# Patient Record
Sex: Female | Born: 1970 | Race: Black or African American | Hispanic: No | Marital: Single | Smoking: Current some day smoker
Health system: Southern US, Community
[De-identification: ages and names within clinical notes are randomized; demographics above are authoritative.]

## PROBLEM LIST (undated history)

## (undated) DIAGNOSIS — N390 Urinary tract infection, site not specified: Secondary | ICD-10-CM

## (undated) DIAGNOSIS — I10 Essential (primary) hypertension: Secondary | ICD-10-CM

## (undated) HISTORY — PX: NO PAST SURGERIES: SHX2092

## (undated) HISTORY — PX: WISDOM TOOTH EXTRACTION: SHX21

---

## 1998-03-17 ENCOUNTER — Emergency Department (HOSPITAL_COMMUNITY): Admission: EM | Admit: 1998-03-17 | Discharge: 1998-03-17 | Payer: Self-pay | Admitting: Emergency Medicine

## 1998-06-04 ENCOUNTER — Emergency Department (HOSPITAL_COMMUNITY): Admission: EM | Admit: 1998-06-04 | Discharge: 1998-06-04 | Payer: Self-pay | Admitting: Emergency Medicine

## 1998-06-25 ENCOUNTER — Emergency Department (HOSPITAL_COMMUNITY): Admission: EM | Admit: 1998-06-25 | Discharge: 1998-06-25 | Payer: Self-pay | Admitting: Emergency Medicine

## 1998-09-14 ENCOUNTER — Emergency Department (HOSPITAL_COMMUNITY): Admission: EM | Admit: 1998-09-14 | Discharge: 1998-09-14 | Payer: Self-pay | Admitting: Family Medicine

## 1998-09-24 ENCOUNTER — Emergency Department (HOSPITAL_COMMUNITY): Admission: EM | Admit: 1998-09-24 | Discharge: 1998-09-24 | Payer: Self-pay | Admitting: Emergency Medicine

## 1998-10-21 ENCOUNTER — Emergency Department (HOSPITAL_COMMUNITY): Admission: EM | Admit: 1998-10-21 | Discharge: 1998-10-21 | Payer: Self-pay

## 1998-12-06 ENCOUNTER — Emergency Department (HOSPITAL_COMMUNITY): Admission: EM | Admit: 1998-12-06 | Discharge: 1998-12-06 | Payer: Self-pay | Admitting: Emergency Medicine

## 1998-12-16 ENCOUNTER — Emergency Department (HOSPITAL_COMMUNITY): Admission: EM | Admit: 1998-12-16 | Discharge: 1998-12-16 | Payer: Self-pay | Admitting: Emergency Medicine

## 1999-02-01 ENCOUNTER — Emergency Department (HOSPITAL_COMMUNITY): Admission: EM | Admit: 1999-02-01 | Discharge: 1999-02-01 | Payer: Self-pay | Admitting: Emergency Medicine

## 1999-03-07 ENCOUNTER — Emergency Department (HOSPITAL_COMMUNITY): Admission: EM | Admit: 1999-03-07 | Discharge: 1999-03-07 | Payer: Self-pay | Admitting: Emergency Medicine

## 1999-04-11 ENCOUNTER — Emergency Department (HOSPITAL_COMMUNITY): Admission: EM | Admit: 1999-04-11 | Discharge: 1999-04-11 | Payer: Self-pay | Admitting: Emergency Medicine

## 1999-04-11 ENCOUNTER — Encounter: Payer: Self-pay | Admitting: Emergency Medicine

## 1999-05-13 ENCOUNTER — Emergency Department (HOSPITAL_COMMUNITY): Admission: EM | Admit: 1999-05-13 | Discharge: 1999-05-13 | Payer: Self-pay | Admitting: *Deleted

## 2000-04-12 ENCOUNTER — Emergency Department (HOSPITAL_COMMUNITY): Admission: EM | Admit: 2000-04-12 | Discharge: 2000-04-12 | Payer: Self-pay | Admitting: Emergency Medicine

## 2000-08-22 ENCOUNTER — Encounter: Payer: Self-pay | Admitting: Emergency Medicine

## 2000-08-22 ENCOUNTER — Emergency Department (HOSPITAL_COMMUNITY): Admission: EM | Admit: 2000-08-22 | Discharge: 2000-08-22 | Payer: Self-pay | Admitting: Emergency Medicine

## 2001-03-10 ENCOUNTER — Encounter (HOSPITAL_BASED_OUTPATIENT_CLINIC_OR_DEPARTMENT_OTHER): Payer: Self-pay | Admitting: General Surgery

## 2001-03-10 ENCOUNTER — Encounter: Admission: RE | Admit: 2001-03-10 | Discharge: 2001-03-10 | Payer: Self-pay | Admitting: Obstetrics

## 2001-03-20 ENCOUNTER — Emergency Department (HOSPITAL_COMMUNITY): Admission: EM | Admit: 2001-03-20 | Discharge: 2001-03-20 | Payer: Self-pay | Admitting: Emergency Medicine

## 2001-06-03 ENCOUNTER — Emergency Department (HOSPITAL_COMMUNITY): Admission: EM | Admit: 2001-06-03 | Discharge: 2001-06-03 | Payer: Self-pay | Admitting: Internal Medicine

## 2001-07-10 ENCOUNTER — Emergency Department (HOSPITAL_COMMUNITY): Admission: EM | Admit: 2001-07-10 | Discharge: 2001-07-10 | Payer: Self-pay | Admitting: Emergency Medicine

## 2001-10-24 ENCOUNTER — Emergency Department (HOSPITAL_COMMUNITY): Admission: EM | Admit: 2001-10-24 | Discharge: 2001-10-24 | Payer: Self-pay | Admitting: Emergency Medicine

## 2001-12-07 ENCOUNTER — Inpatient Hospital Stay (HOSPITAL_COMMUNITY): Admission: AD | Admit: 2001-12-07 | Discharge: 2001-12-07 | Payer: Self-pay | Admitting: *Deleted

## 2002-03-13 ENCOUNTER — Inpatient Hospital Stay (HOSPITAL_COMMUNITY): Admission: AD | Admit: 2002-03-13 | Discharge: 2002-03-13 | Payer: Self-pay | Admitting: *Deleted

## 2002-03-15 ENCOUNTER — Emergency Department (HOSPITAL_COMMUNITY): Admission: EM | Admit: 2002-03-15 | Discharge: 2002-03-15 | Payer: Self-pay | Admitting: Emergency Medicine

## 2002-06-17 ENCOUNTER — Emergency Department (HOSPITAL_COMMUNITY): Admission: EM | Admit: 2002-06-17 | Discharge: 2002-06-17 | Payer: Self-pay | Admitting: Emergency Medicine

## 2002-06-26 ENCOUNTER — Inpatient Hospital Stay (HOSPITAL_COMMUNITY): Admission: AD | Admit: 2002-06-26 | Discharge: 2002-06-26 | Payer: Self-pay | Admitting: Obstetrics and Gynecology

## 2004-02-25 ENCOUNTER — Inpatient Hospital Stay (HOSPITAL_COMMUNITY): Admission: AD | Admit: 2004-02-25 | Discharge: 2004-02-25 | Payer: Self-pay | Admitting: Obstetrics & Gynecology

## 2005-05-06 ENCOUNTER — Inpatient Hospital Stay (HOSPITAL_COMMUNITY): Admission: AD | Admit: 2005-05-06 | Discharge: 2005-05-06 | Payer: Self-pay | Admitting: *Deleted

## 2005-07-29 ENCOUNTER — Emergency Department (HOSPITAL_COMMUNITY): Admission: EM | Admit: 2005-07-29 | Discharge: 2005-07-29 | Payer: Self-pay | Admitting: Emergency Medicine

## 2005-10-22 ENCOUNTER — Emergency Department (HOSPITAL_COMMUNITY): Admission: EM | Admit: 2005-10-22 | Discharge: 2005-10-22 | Payer: Self-pay | Admitting: Emergency Medicine

## 2006-02-03 ENCOUNTER — Emergency Department (HOSPITAL_COMMUNITY): Admission: EM | Admit: 2006-02-03 | Discharge: 2006-02-03 | Payer: Self-pay | Admitting: *Deleted

## 2007-01-14 ENCOUNTER — Emergency Department (HOSPITAL_COMMUNITY): Admission: EM | Admit: 2007-01-14 | Discharge: 2007-01-14 | Payer: Self-pay | Admitting: Emergency Medicine

## 2007-09-09 ENCOUNTER — Emergency Department (HOSPITAL_COMMUNITY): Admission: EM | Admit: 2007-09-09 | Discharge: 2007-09-10 | Payer: Self-pay | Admitting: Emergency Medicine

## 2007-09-12 ENCOUNTER — Emergency Department (HOSPITAL_COMMUNITY): Admission: EM | Admit: 2007-09-12 | Discharge: 2007-09-12 | Payer: Self-pay | Admitting: Emergency Medicine

## 2007-11-27 ENCOUNTER — Emergency Department (HOSPITAL_COMMUNITY): Admission: EM | Admit: 2007-11-27 | Discharge: 2007-11-27 | Payer: Self-pay | Admitting: Family Medicine

## 2009-01-02 ENCOUNTER — Emergency Department (HOSPITAL_COMMUNITY): Admission: EM | Admit: 2009-01-02 | Discharge: 2009-01-02 | Payer: Self-pay | Admitting: Emergency Medicine

## 2010-01-16 ENCOUNTER — Emergency Department (HOSPITAL_COMMUNITY): Admission: EM | Admit: 2010-01-16 | Discharge: 2010-01-16 | Payer: Self-pay | Admitting: Emergency Medicine

## 2011-02-18 ENCOUNTER — Inpatient Hospital Stay (INDEPENDENT_AMBULATORY_CARE_PROVIDER_SITE_OTHER)
Admission: RE | Admit: 2011-02-18 | Discharge: 2011-02-18 | Disposition: A | Discharge status: Home / Self Care | Payer: Self-pay | Source: Ambulatory Visit | Attending: Family Medicine | Admitting: Family Medicine

## 2011-02-18 DIAGNOSIS — T148XXA Other injury of unspecified body region, initial encounter: Secondary | ICD-10-CM

## 2011-02-18 DIAGNOSIS — W503XXA Accidental bite by another person, initial encounter: Secondary | ICD-10-CM

## 2011-02-20 ENCOUNTER — Inpatient Hospital Stay (HOSPITAL_COMMUNITY)
Admission: RE | Admit: 2011-02-20 | Discharge: 2011-02-20 | Disposition: A | Discharge status: Home / Self Care | Payer: Self-pay | Source: Ambulatory Visit | Attending: Family Medicine | Admitting: Family Medicine

## 2011-03-06 ENCOUNTER — Inpatient Hospital Stay (INDEPENDENT_AMBULATORY_CARE_PROVIDER_SITE_OTHER)
Admission: RE | Admit: 2011-03-06 | Discharge: 2011-03-06 | Disposition: A | Discharge status: Home / Self Care | Payer: Self-pay | Source: Ambulatory Visit | Attending: Emergency Medicine | Admitting: Emergency Medicine

## 2011-03-06 DIAGNOSIS — N949 Unspecified condition associated with female genital organs and menstrual cycle: Secondary | ICD-10-CM

## 2011-03-06 LAB — WET PREP, GENITAL: Yeast Wet Prep HPF POC: NONE SEEN

## 2011-03-07 LAB — GC/CHLAMYDIA PROBE AMP, GENITAL: GC Probe Amp, Genital: NEGATIVE

## 2011-06-14 ENCOUNTER — Inpatient Hospital Stay (INDEPENDENT_AMBULATORY_CARE_PROVIDER_SITE_OTHER)
Admission: RE | Admit: 2011-06-14 | Discharge: 2011-06-14 | Disposition: A | Discharge status: Home / Self Care | Payer: Self-pay | Source: Ambulatory Visit | Attending: Family Medicine | Admitting: Family Medicine

## 2011-06-14 DIAGNOSIS — R05 Cough: Secondary | ICD-10-CM

## 2011-06-14 DIAGNOSIS — J029 Acute pharyngitis, unspecified: Secondary | ICD-10-CM

## 2011-08-14 LAB — CULTURE, ROUTINE-ABSCESS: Gram Stain: NONE SEEN

## 2011-08-14 LAB — RPR: RPR Ser Ql: NONREACTIVE

## 2011-08-14 LAB — URINALYSIS, ROUTINE W REFLEX MICROSCOPIC
Bilirubin Urine: NEGATIVE
Ketones, ur: NEGATIVE
pH: 6.5

## 2011-08-14 LAB — WET PREP, GENITAL
Trich, Wet Prep: NONE SEEN
Yeast Wet Prep HPF POC: NONE SEEN

## 2011-08-14 LAB — URINE CULTURE: Colony Count: 15000

## 2011-08-14 LAB — GC/CHLAMYDIA PROBE AMP, GENITAL: Chlamydia, DNA Probe: NEGATIVE

## 2011-08-14 LAB — URINE MICROSCOPIC-ADD ON

## 2011-09-05 ENCOUNTER — Ambulatory Visit (INDEPENDENT_AMBULATORY_CARE_PROVIDER_SITE_OTHER): Payer: Self-pay

## 2011-09-05 ENCOUNTER — Inpatient Hospital Stay (INDEPENDENT_AMBULATORY_CARE_PROVIDER_SITE_OTHER)
Admission: RE | Admit: 2011-09-05 | Discharge: 2011-09-05 | Disposition: A | Discharge status: Home / Self Care | Payer: Self-pay | Source: Ambulatory Visit | Attending: Family Medicine | Admitting: Family Medicine

## 2011-09-05 DIAGNOSIS — J40 Bronchitis, not specified as acute or chronic: Secondary | ICD-10-CM

## 2011-09-05 DIAGNOSIS — J019 Acute sinusitis, unspecified: Secondary | ICD-10-CM

## 2011-09-05 DIAGNOSIS — I10 Essential (primary) hypertension: Secondary | ICD-10-CM

## 2011-09-05 LAB — POCT I-STAT, CHEM 8
Chloride: 102 mEq/L (ref 96–112)
Creatinine, Ser: 0.9 mg/dL (ref 0.50–1.10)
Glucose, Bld: 115 mg/dL — ABNORMAL HIGH (ref 70–99)
HCT: 44 % (ref 36.0–46.0)
Hemoglobin: 15 g/dL (ref 12.0–15.0)
Sodium: 138 mEq/L (ref 135–145)

## 2013-06-08 ENCOUNTER — Telehealth: Payer: Self-pay | Admitting: Advanced Practice Midwife

## 2013-06-08 ENCOUNTER — Inpatient Hospital Stay (HOSPITAL_COMMUNITY)
Admission: AD | Admit: 2013-06-08 | Discharge: 2013-06-08 | Disposition: A | Discharge status: Home / Self Care | Payer: Self-pay | Source: Ambulatory Visit | Attending: Obstetrics and Gynecology | Admitting: Obstetrics and Gynecology

## 2013-06-08 ENCOUNTER — Encounter (HOSPITAL_COMMUNITY): Payer: Self-pay

## 2013-06-08 DIAGNOSIS — I10 Essential (primary) hypertension: Secondary | ICD-10-CM | POA: Insufficient documentation

## 2013-06-08 DIAGNOSIS — Z2089 Contact with and (suspected) exposure to other communicable diseases: Secondary | ICD-10-CM

## 2013-06-08 DIAGNOSIS — Z202 Contact with and (suspected) exposure to infections with a predominantly sexual mode of transmission: Secondary | ICD-10-CM | POA: Insufficient documentation

## 2013-06-08 DIAGNOSIS — A5901 Trichomonal vulvovaginitis: Secondary | ICD-10-CM | POA: Insufficient documentation

## 2013-06-08 HISTORY — DX: Urinary tract infection, site not specified: N39.0

## 2013-06-08 LAB — WET PREP, GENITAL: Yeast Wet Prep HPF POC: NONE SEEN

## 2013-06-08 MED ORDER — LISINOPRIL 10 MG PO TABS: 10.0000 mg | ORAL_TABLET | Freq: Every day | ORAL | Status: DC

## 2013-06-08 MED ORDER — METRONIDAZOLE 500 MG PO TABS: 2000.0000 mg | ORAL_TABLET | Freq: Once | ORAL | Status: AC

## 2013-06-08 MED ORDER — PROMETHAZINE HCL 25 MG PO TABS: 25.0000 mg | ORAL_TABLET | Freq: Four times a day (QID) | ORAL | Status: DC | PRN

## 2013-06-08 NOTE — MAU Note (Signed)
Patient denies any complaints, discomfort. She states that a friend of hers (a man that she is not sexually involved with) that is in the hospital called her and told her that she needs to be tested. He is diagnosed with Trich. Patient denies of pain, dysuria, vaginal bleeding or discharge.

## 2013-06-08 NOTE — MAU Note (Signed)
Patient states a friend of hers has trich and she was told she needed to come and get checked. States she does not have sexual contact with this person. Denies any problems.

## 2013-06-08 NOTE — MAU Provider Note (Signed)
Chief Complaint: No chief complaint on file.   First Provider Initiated Contact with Patient 06/08/13 1212     SUBJECTIVE HPI: Chelsea Washington is a 42 y.o. non-pregnant who presents to MAU stating that she has a friend who was Dx w/ oral Trichomonas and was told that all of his contacts needed to be tested for Trich. Pt denies any type of sexual or close physical contact with this person and denies vaginal discharge.   Past Medical History  Diagnosis Date  . UTI (lower urinary tract infection)    OB History   Grav Para Term Preterm Abortions TAB SAB Ect Mult Living                 Past Surgical History  Procedure Laterality Date  . Wisdom tooth extraction     History   Social History  . Marital Status: Single    Spouse Name: N/A    Number of Children: N/A  . Years of Education: N/A   Occupational History  . Not on file.   Social History Main Topics  . Smoking status: Not on file  . Smokeless tobacco: Not on file  . Alcohol Use: No  . Drug Use: No  . Sexually Active: Not Currently    Birth Control/ Protection: None   Other Topics Concern  . Not on file   Social History Narrative  . No narrative on file   No current facility-administered medications on file prior to encounter.   No current outpatient prescriptions on file prior to encounter.   Allergies  Allergen Reactions  . Peach (Prunus Persica) Hives    ROS: Pertinent pos items in HPI. Denies chest pain, SOB, HA.   OBJECTIVE Blood pressure 170/108, pulse 78, temperature 98.4 F (36.9 C), temperature source Oral, resp. rate 16, height 5\' 5"  (1.651 m), weight 59.512 kg (131 lb 3.2 oz), last menstrual period 06/05/2013, SpO2 100.00%. Patient Vitals for the past 24 hrs:  BP Temp Temp src Pulse Resp SpO2 Height Weight  06/08/13 1212 179/117 mmHg - - 69 16 - - -  06/08/13 0954 170/108 mmHg 98.4 F (36.9 C) Oral 78 16 100 % 5\' 5"  (1.651 m) 59.512 kg (131 lb 3.2 oz)    GENERAL: Well-developed, well-nourished  female in no acute distress.  HEENT: Normocephalic HEART: normal rate and rhythm.  RESP: normal effort ABDOMEN: Soft, non-tender EXTREMITIES: Nontender, no edema NEURO: Alert and oriented SPECULUM EXAM: NEFG, small amount of mildly malodorous blood noted, cervix clean BIMANUAL: cervix closed; uterus normal size, no adnexal tenderness or masses. No CMT.  LAB RESULTS Results for orders placed during the hospital encounter of 06/08/13 (from the past 24 hour(s))  WET PREP, GENITAL     Status: Abnormal   Collection Time    06/08/13 11:50 AM      Result Value Range   Yeast Wet Prep HPF POC NONE SEEN  NONE SEEN   Trich, Wet Prep FEW (*) NONE SEEN   Clue Cells Wet Prep HPF POC NONE SEEN  NONE SEEN   WBC, Wet Prep HPF POC FEW (*) NONE SEEN    IMAGING No results found.  MAU COURSE Initially denied ever being told that she was hypertensive, then stated that she had been informed of HTN in past. No PCP. No meds. Per consult w/ Dr. Jolayne Panther will start Lisinopril and refer to [facility].    ASSESSMENT 1. Exposure to STD-Trichimonas.   2. Hypertension-uncontrolled    PLAN Discharge home in stable condition.  Hypertension precautions strongly reviewed. Go to ED for chest pain , SOB, HA, stroke Sx.  GC/CT pending. Follow-up Information   Follow up with Gynecologist. (As needed)       Follow up with [facility]. (As needed if symptoms worsen)    Contact information:   [address] [id number] [place] [address] [phone]       Medication List         lisinopril 10 MG tablet  Commonly known as:  PRINIVIL,ZESTRIL  Take 1 tablet (10 mg total) by mouth daily.     OVER THE COUNTER MEDICATION  Take 2 tablets by mouth 2 (two) times daily as needed (cramps/pms symptoms). Over the counter menstrual medication for symptoms        [place], [place] 06/08/2013  12:11 PM

## 2013-06-08 NOTE — Telephone Encounter (Signed)
Wet prep pos trich.

## 2013-06-10 NOTE — MAU Provider Note (Signed)
Attestation of Attending Supervision of Advanced Practitioner (CNM/NP): Evaluation and management procedures were performed by the Advanced Practitioner under my supervision and collaboration.  I have reviewed the Advanced Practitioner's note and chart, and I agree with the management and plan.  Megha Agnes 06/10/2013 10:26 AM   

## 2014-11-24 ENCOUNTER — Encounter (HOSPITAL_COMMUNITY): Payer: Self-pay | Admitting: *Deleted

## 2014-11-24 ENCOUNTER — Inpatient Hospital Stay (HOSPITAL_COMMUNITY)
Admission: AD | Admit: 2014-11-24 | Discharge: 2014-11-24 | Disposition: A | Discharge status: Home / Self Care | Payer: Self-pay | Source: Ambulatory Visit | Attending: Obstetrics & Gynecology | Admitting: Obstetrics & Gynecology

## 2014-11-24 DIAGNOSIS — R109 Unspecified abdominal pain: Secondary | ICD-10-CM | POA: Insufficient documentation

## 2014-11-24 DIAGNOSIS — A5901 Trichomonal vulvovaginitis: Secondary | ICD-10-CM

## 2014-11-24 HISTORY — DX: Essential (primary) hypertension: I10

## 2014-11-24 LAB — WET PREP, GENITAL
CLUE CELLS WET PREP: NONE SEEN
WBC WET PREP: NONE SEEN
YEAST WET PREP: NONE SEEN

## 2014-11-24 LAB — POCT PREGNANCY, URINE: Preg Test, Ur: NEGATIVE

## 2014-11-24 LAB — URINALYSIS, ROUTINE W REFLEX MICROSCOPIC
Bilirubin Urine: NEGATIVE
GLUCOSE, UA: NEGATIVE mg/dL
KETONES UR: 15 mg/dL — AB
LEUKOCYTES UA: NEGATIVE
Nitrite: NEGATIVE
PH: 5.5 (ref 5.0–8.0)
PROTEIN: NEGATIVE mg/dL
Urobilinogen, UA: 0.2 mg/dL (ref 0.0–1.0)

## 2014-11-24 LAB — URINE MICROSCOPIC-ADD ON

## 2014-11-24 MED ORDER — METRONIDAZOLE 500 MG PO TABS: 2000.0000 mg | ORAL_TABLET | Freq: Once | ORAL | Status: DC

## 2014-11-24 NOTE — Discharge Instructions (Signed)
Trichomoniasis Trichomoniasis is an infection caused by an organism called Trichomonas. The infection can affect both women and men. In women, the outer female genitalia and the vagina are affected. In men, the penis is mainly affected, but the prostate and other reproductive organs can also be involved. Trichomoniasis is a sexually transmitted infection (STI) and is most often passed to another person through sexual contact.  RISK FACTORS  Having unprotected sexual intercourse.  Having sexual intercourse with an infected partner. SIGNS AND SYMPTOMS  Symptoms of trichomoniasis in women include:  Abnormal gray-green frothy vaginal discharge.  Itching and irritation of the vagina.  Itching and irritation of the area outside the vagina. Symptoms of trichomoniasis in men include:   Penile discharge with or without pain.  Pain during urination. This results from inflammation of the urethra. DIAGNOSIS  Trichomoniasis may be found during a Pap test or physical exam. Your health care provider may use one of the following methods to help diagnose this infection:  Examining vaginal discharge under a microscope. For men, urethral discharge would be examined.  Testing the pH of the vagina with a test tape.  Using a vaginal swab test that checks for the Trichomonas organism. A test is available that provides results within a few minutes.  Doing a culture test for the organism. This is not usually needed. TREATMENT   You may be given medicine to fight the infection. Women should inform their health care provider if they could be or are pregnant. Some medicines used to treat the infection should not be taken during pregnancy.  Your health care provider may recommend over-the-counter medicines or creams to decrease itching or irritation.  Your sexual partner will need to be treated if infected. HOME CARE INSTRUCTIONS   Take medicines only as directed by your health care provider.  Take  over-the-counter medicine for itching or irritation as directed by your health care provider.  Do not have sexual intercourse while you have the infection.  Women should not douche or wear tampons while they have the infection.  Discuss your infection with your partner. Your partner may have gotten the infection from you, or you may have gotten it from your partner.  Have your sex partner get examined and treated if necessary.  Practice safe, informed, and protected sex.  See your health care provider for other STI testing. SEEK MEDICAL CARE IF:   You still have symptoms after you finish your medicine.  You develop abdominal pain.  You have pain when you urinate.  You have bleeding after sexual intercourse.  You develop a rash.  Your medicine makes you sick or makes you throw up (vomit). MAKE SURE YOU:  Understand these instructions.  Will watch your condition.  Will get help right away if you are not doing well or get worse. Document Released: 04/17/2001 Document Revised: 03/08/2014 Document Reviewed: 08/03/2013 [facility] Patient Information 2015 [place], [place]. This information is not intended to replace advice given to you by your health care provider. Make sure you discuss any questions you have with your health care provider.  Sexually Transmitted Disease A sexually transmitted disease (STD) is a disease or infection often passed to another person during sex. However, STDs can be passed through nonsexual ways. An STD can be passed through:  Spit (saliva).  Semen.  Blood.  Mucus from the vagina.  Pee (urine). HOW CAN I LESSEN MY CHANCES OF GETTING AN STD?  Use:  Latex condoms.  Water-soluble lubricants with condoms. Do not use petroleum  jelly or oils.  Dental dams. These are small pieces of latex that are used as a barrier during oral sex.  Avoid having more than one sex partner.  Do not have sex with someone who has other sex partners.  Do not have  sex with anyone you do not know or who is at high risk for an STD.  Avoid risky sex that can break your skin.  Do not have sex if you have open sores on your mouth or skin.  Avoid drinking too much alcohol or taking illegal drugs. Alcohol and drugs can affect your good judgment.  Avoid oral and anal sex acts.  Get shots (vaccines) for HPV and hepatitis.  If you are at risk of being infected with HIV, it is advised that you take a certain medicine daily to prevent HIV infection. This is called pre-exposure prophylaxis (PrEP). You may be at risk if:  You are a man who has sex with other men (MSM).  You are attracted to the opposite sex (heterosexual) and are having sex with more than one partner.  You take drugs with a needle.  You have sex with someone who has HIV.  Talk with your doctor about if you are at high risk of being infected with HIV. If you begin to take PrEP, get tested for HIV first. Get tested every 3 months for as long as you are taking PrEP. WHAT SHOULD I DO IF I THINK I HAVE AN STD?  See your doctor.  Tell your sex partner(s) that you have an STD. They should be tested and treated.  Do not have sex until your doctor says it is okay. WHEN SHOULD I GET HELP? Get help right away if:  You have bad belly (abdominal) pain.  You are a man and have puffiness (swelling) or pain in your testicles.  You are a woman and have puffiness in your vagina. Document Released: 11/29/2004 Document Revised: 10/27/2013 Document Reviewed: 04/17/2013 [facility] Patient Information 2015 [place], [place]. This information is not intended to replace advice given to you by your health care provider. Make sure you discuss any questions you have with your health care provider.

## 2014-11-24 NOTE — MAU Note (Signed)
Hasn't had a period in 2 months, has not done HPT.  Lower abd pain, worse on left for the last week.

## 2014-11-24 NOTE — MAU Note (Signed)
Pt states she is here because she has been exposed to an STD by her boyfriend who was told by his girlfriend that she has an STD

## 2014-11-24 NOTE — MAU Provider Note (Signed)
History     CSN: [id number]  Arrival date and time: 11/24/14 1046   First Provider Initiated Contact with Patient 11/24/14 1521      Chief Complaint  Patient presents with  . possible pregnant   . Abdominal Pain   HPI Comments: Chelsea Washington 44 y.o. Presents to the MAU stating that she is having lower abdominal pain and urinary symptoms like a urinary tract infection. This pt reports that her boyfriend informed her that he has given her an STD . Pt is visibly upset and desires STD testing.  Abdominal Pain Associated symptoms include frequency.      Past Medical History  Diagnosis Date  . UTI (lower urinary tract infection)   . Hypertension     Past Surgical History  Procedure Laterality Date  . Wisdom tooth extraction    . No past surgeries      Family History  Problem Relation Age of Onset  . Hypertension Mother   . Asthma Sister   . Asthma Brother   . Hypertension Maternal Aunt   . Hypertension Maternal Uncle     History  Substance Use Topics  . Smoking status: Not on file  . Smokeless tobacco: Not on file  . Alcohol Use: No    Allergies:  Allergies  Allergen Reactions  . Peach [Prunus Persica] Hives    Prescriptions prior to admission  Medication Sig Dispense Refill Last Dose  . lisinopril (PRINIVIL,ZESTRIL) 10 MG tablet Take 1 tablet (10 mg total) by mouth daily. 30 tablet 1 Past Week at Unknown time  . OVER THE COUNTER MEDICATION Take 2 tablets by mouth 2 (two) times daily as needed (cramps/pms symptoms). Over the counter menstrual medication for symptoms   more than one month  . promethazine (PHENERGAN) 25 MG tablet Take 1 tablet (25 mg total) by mouth every 6 (six) hours as needed for nausea. 10 tablet 0 more than one month    Review of Systems  Gastrointestinal: Positive for abdominal pain.       Lower abdominal pain  Genitourinary: Positive for urgency and frequency.  All other systems reviewed and are negative.  Physical Exam   Blood  pressure 128/84, pulse 79, temperature 98 F (36.7 C), temperature source Oral, resp. rate 16, height 5\' 1"  (1.549 m), weight 56.972 kg (125 lb 9.6 oz), last menstrual period 09/08/2014.  Physical Exam  Constitutional: She appears well-developed and well-nourished.  HENT:  Head: Normocephalic and atraumatic.  Neck: Normal range of motion.  Respiratory: Effort normal.  GI: Soft.  Genitourinary: Vaginal discharge found.  Large amount of white discharge visualized on spec exam.   Results for orders placed or performed during the hospital encounter of 11/24/14 (from the past 24 hour(s))  Urinalysis, Routine w reflex microscopic     Status: Abnormal   Collection Time: 11/24/14 11:35 AM  Result Value Ref Range   Color, Urine YELLOW YELLOW   APPearance CLEAR CLEAR   Specific Gravity, Urine >1.030 (H) 1.005 - 1.030   pH 5.5 5.0 - 8.0   Glucose, UA NEGATIVE NEGATIVE mg/dL   Hgb urine dipstick TRACE (A) NEGATIVE   Bilirubin Urine NEGATIVE NEGATIVE   Ketones, ur 15 (A) NEGATIVE mg/dL   Protein, ur NEGATIVE NEGATIVE mg/dL   Urobilinogen, UA 0.2 0.0 - 1.0 mg/dL   Nitrite NEGATIVE NEGATIVE   Leukocytes, UA NEGATIVE NEGATIVE  Urine microscopic-add on     Status: None   Collection Time: 11/24/14 11:35 AM  Result Value Ref Range  Squamous Epithelial / LPF RARE RARE   WBC, UA 0-2 <3 WBC/hpf   RBC / HPF 0-2 <3 RBC/hpf   Urine-Other MUCOUS PRESENT   Pregnancy, urine POC     Status: None   Collection Time: 11/24/14 11:41 AM  Result Value Ref Range   Preg Test, Ur NEGATIVE NEGATIVE  Wet prep, genital     Status: Abnormal   Collection Time: 11/24/14  3:30 PM  Result Value Ref Range   Yeast Wet Prep HPF POC NONE SEEN NONE SEEN   Trich, Wet Prep MANY (A) NONE SEEN   Clue Cells Wet Prep HPF POC NONE SEEN NONE SEEN   WBC, Wet Prep HPF POC NONE SEEN NONE SEEN    MAU Course  Procedures  MDM Speculum exam STD testing pending results UA  Assessment and Plan  A: Trichomonas  P: Flagyl 2  Grams  Partner referral Avoid intercourse and alcohol x 7 days Advised to follow up with GYN Return to MAU as needed  [place], Milas Kocher 11/24/2014, 3:33 PM

## 2014-11-25 LAB — GC/CHLAMYDIA PROBE AMP (~~LOC~~) NOT AT ARMC
Chlamydia: NEGATIVE
NEISSERIA GONORRHEA: NEGATIVE

## 2014-11-25 LAB — HIV ANTIBODY (ROUTINE TESTING W REFLEX)
HIV 1/HIV 2 AB: NONREACTIVE
HIV 1/O/2 Abs-Index Value: <1 (ref <1.00)

## 2015-02-17 ENCOUNTER — Emergency Department (HOSPITAL_COMMUNITY): Payer: Self-pay

## 2015-02-17 ENCOUNTER — Encounter (HOSPITAL_COMMUNITY): Payer: Self-pay | Admitting: Emergency Medicine

## 2015-02-17 ENCOUNTER — Emergency Department (HOSPITAL_COMMUNITY)
Admission: EM | Admit: 2015-02-17 | Discharge: 2015-02-17 | Disposition: A | Discharge status: Home / Self Care | Payer: Self-pay | Attending: Emergency Medicine | Admitting: Emergency Medicine

## 2015-02-17 DIAGNOSIS — R05 Cough: Secondary | ICD-10-CM

## 2015-02-17 DIAGNOSIS — J159 Unspecified bacterial pneumonia: Secondary | ICD-10-CM | POA: Insufficient documentation

## 2015-02-17 DIAGNOSIS — Z79899 Other long term (current) drug therapy: Secondary | ICD-10-CM | POA: Insufficient documentation

## 2015-02-17 DIAGNOSIS — Z87440 Personal history of urinary (tract) infections: Secondary | ICD-10-CM | POA: Insufficient documentation

## 2015-02-17 DIAGNOSIS — I10 Essential (primary) hypertension: Secondary | ICD-10-CM | POA: Insufficient documentation

## 2015-02-17 DIAGNOSIS — R059 Cough, unspecified: Secondary | ICD-10-CM

## 2015-02-17 DIAGNOSIS — J189 Pneumonia, unspecified organism: Secondary | ICD-10-CM

## 2015-02-17 DIAGNOSIS — Z3202 Encounter for pregnancy test, result negative: Secondary | ICD-10-CM | POA: Insufficient documentation

## 2015-02-17 LAB — POC URINE PREG, ED: PREG TEST UR: NEGATIVE

## 2015-02-17 MED ORDER — ONDANSETRON HCL 4 MG/2ML IJ SOLN
4.0000 mg | Freq: Once | INTRAMUSCULAR | Status: AC
  Administered 2015-02-17: 4 mg via INTRAVENOUS
  Filled 2015-02-17: qty 2

## 2015-02-17 MED ORDER — LEVOFLOXACIN 750 MG PO TABS
750.0000 mg | ORAL_TABLET | Freq: Once | ORAL | Status: AC
  Administered 2015-02-17: 750 mg via ORAL
  Filled 2015-02-17: qty 1

## 2015-02-17 MED ORDER — LISINOPRIL 10 MG PO TABS: 10.0000 mg | ORAL_TABLET | Freq: Every day | ORAL | Status: DC

## 2015-02-17 MED ORDER — SODIUM CHLORIDE 0.9 % IV BOLUS (SEPSIS)
1000.0000 mL | Freq: Once | INTRAVENOUS | Status: AC
  Administered 2015-02-17: 1000 mL via INTRAVENOUS

## 2015-02-17 MED ORDER — LEVOFLOXACIN 750 MG PO TABS: 750.0000 mg | ORAL_TABLET | Freq: Once | ORAL | Status: DC

## 2015-02-17 MED ORDER — LISINOPRIL 10 MG PO TABS: 10.0000 mg | ORAL_TABLET | Freq: Every day | ORAL | Status: AC

## 2015-02-17 MED ORDER — MORPHINE SULFATE 4 MG/ML IJ SOLN
4.0000 mg | Freq: Once | INTRAMUSCULAR | Status: AC
  Administered 2015-02-17: 4 mg via INTRAVENOUS
  Filled 2015-02-17: qty 1

## 2015-02-17 NOTE — ED Notes (Signed)
PT had temp of 101 since Sunday night; anorexia; vomited once everyday. Generally feels unwell. Has taken OTC cold medicine.

## 2015-02-17 NOTE — ED Notes (Signed)
IV removed from Left arm, bleeding controlled. 2X2 applied with tape.

## 2015-02-17 NOTE — ED Notes (Signed)
At ambulated on RA and maintained an oxygen saturation level of 99-100%.

## 2015-02-17 NOTE — ED Provider Notes (Signed)
CSN: [id number]     Arrival date & time 02/17/15  6789 History   First MD Initiated Contact with Patient 02/17/15 [phone]     Chief Complaint  Patient presents with  . Generalized Body Aches     (Consider location/radiation/quality/duration/timing/severity/associated sxs/prior Treatment) HPI Comments: Patient presents to the emergency department with chief complaint of generalized body aches, fevers, chills, nausea, vomiting, and some diarrhea. She states that she has had a MAXIMUM TEMPERATURE of 101. She has taken OTC cough and cold medications with some relief. She denies getting a flu shot this year.  She states that she is able to tolerate fluids and apple sauce, but doesn't have an appetite.  She states that she thinks she has the flu, but wants to be certain she doesn't have pneumonia.  She denies any dysuria.  The history is provided by the patient. No language interpreter was used.    Past Medical History  Diagnosis Date  . UTI (lower urinary tract infection)   . Hypertension    Past Surgical History  Procedure Laterality Date  . Wisdom tooth extraction    . No past surgeries     Family History  Problem Relation Age of Onset  . Hypertension Mother   . Asthma Sister   . Asthma Brother   . Hypertension Maternal Aunt   . Hypertension Maternal Uncle    History  Substance Use Topics  . Smoking status: Not on file  . Smokeless tobacco: Not on file  . Alcohol Use: No   OB History    No data available     Review of Systems  Constitutional: Positive for fever and chills.  HENT: Positive for postnasal drip, rhinorrhea, sinus pressure, sneezing and sore throat.   Respiratory: Positive for cough. Negative for shortness of breath.   Cardiovascular: Negative for chest pain.  Gastrointestinal: Positive for nausea, vomiting and diarrhea. Negative for abdominal pain and constipation.  Genitourinary: Negative for dysuria.      Allergies  Peach  Home Medications   Prior  to Admission medications   Medication Sig Start Date End Date Taking? Authorizing Provider  lisinopril (PRINIVIL,ZESTRIL) 10 MG tablet Take 1 tablet (10 mg total) by mouth daily. 06/08/13 11/24/14  Manya Silvas, CNM  metroNIDAZOLE (FLAGYL) 500 MG tablet Take 4 tablets (2,000 mg total) by mouth once. 11/24/14   Olegario Messier, NP  OVER THE COUNTER MEDICATION Take 2 tablets by mouth 2 (two) times daily as needed (cramps/pms symptoms). Over the counter menstrual medication for symptoms    Historical Provider, MD  promethazine (PHENERGAN) 25 MG tablet Take 1 tablet (25 mg total) by mouth every 6 (six) hours as needed for nausea. 06/08/13   Thersa Salt Smith, CNM   BP 141/100 mmHg  Pulse 89  Temp(Src) 99.9 F (37.7 C) (Oral)  Resp 18  SpO2 100%  LMP 02/03/2015 Physical Exam  Constitutional: She appears well-developed and well-nourished. No distress.  HENT:  Head: Normocephalic.  Right Ear: External ear normal.  Left Ear: External ear normal.  Mildly erythematous, no tonsillar exudate, no abscess, no stridor, uvula is midline  TMs clear bilaterally  Eyes: Conjunctivae and EOM are normal. Pupils are equal, round, and reactive to light.  Neck: Normal range of motion. Neck supple.  Cardiovascular: Normal rate, regular rhythm and normal heart sounds.  Exam reveals no gallop and no friction rub.   No murmur heard. Pulmonary/Chest: Effort normal and breath sounds normal. No stridor. No respiratory distress. She has no wheezes. She has  no rales. She exhibits no tenderness.  CTAB  Abdominal: Soft. Bowel sounds are normal. She exhibits no distension. There is no tenderness.  Musculoskeletal: Normal range of motion. She exhibits no tenderness.  Neurological: She is alert.  Skin: Skin is warm and dry. No rash noted. She is not diaphoretic.  Psychiatric: She has a normal mood and affect. Her behavior is normal. Judgment and thought content normal.  Nursing note and vitals reviewed.   ED Course   Procedures (including critical care time) Results for orders placed or performed during the hospital encounter of 02/17/15  POC Urine Pregnancy, ED (do NOT order at [facility])  Result Value Ref Range   Preg Test, Ur NEGATIVE NEGATIVE   Dg Chest 2 View  02/17/2015   CLINICAL DATA:  Fever and chills since 02/13/2015.  EXAM: CHEST  2 VIEW  COMPARISON:  PA and lateral chest 09/05/2011 and 01/14/2007.  FINDINGS: Small areas of patchy airspace disease are seen in the right middle and left lower lobes. The lungs are otherwise clear. No pneumothorax or pleural effusion. Heart size is normal.  IMPRESSION: Patchy right middle and left lower lobe airspace disease worrisome for pneumonia.   Electronically Signed   By: Inge Rise M.D.   On: 02/17/2015 09:44      EKG Interpretation None      MDM   Final diagnoses:  Cough  Community acquired pneumonia    Patient with fevers, chills, cough, and generalized body aches.  Suspect flu, but will check CXR.  CXR remarkable for pneumonia.  Will treat with levaquin. Recommend PCP follow-up.  Return precautions given.  Patient is stable and ready for discharge.  Ambulates well maintaining 99-100% O2 saturation.  Filed Vitals:   02/17/15 1030  BP: 133/87  Pulse: 80  Temp:   Resp:   .    Montine Circle, PA-C 02/17/15 Andrew Yao, MD 02/17/15 [phone]

## 2015-02-17 NOTE — ED Notes (Signed)
Pt comfortable with discharge and follow up instructions. Pt declines wheelchair, escorted to waiting area by this RN. Prescriptions x2. 

## 2015-02-17 NOTE — ED Notes (Signed)
Patient transported to x-ray. ?

## 2015-02-17 NOTE — Discharge Instructions (Signed)

## 2015-02-21 ENCOUNTER — Emergency Department (HOSPITAL_COMMUNITY)
Admission: EM | Admit: 2015-02-21 | Discharge: 2015-02-21 | Disposition: A | Discharge status: Home / Self Care | Payer: Self-pay | Attending: Emergency Medicine | Admitting: Emergency Medicine

## 2015-02-21 ENCOUNTER — Emergency Department (HOSPITAL_COMMUNITY): Payer: Self-pay

## 2015-02-21 ENCOUNTER — Encounter (HOSPITAL_COMMUNITY): Payer: Self-pay | Admitting: Emergency Medicine

## 2015-02-21 DIAGNOSIS — I10 Essential (primary) hypertension: Secondary | ICD-10-CM | POA: Insufficient documentation

## 2015-02-21 DIAGNOSIS — Z880 Allergy status to penicillin: Secondary | ICD-10-CM | POA: Insufficient documentation

## 2015-02-21 DIAGNOSIS — Z79899 Other long term (current) drug therapy: Secondary | ICD-10-CM | POA: Insufficient documentation

## 2015-02-21 DIAGNOSIS — Z87440 Personal history of urinary (tract) infections: Secondary | ICD-10-CM | POA: Insufficient documentation

## 2015-02-21 DIAGNOSIS — Z7982 Long term (current) use of aspirin: Secondary | ICD-10-CM | POA: Insufficient documentation

## 2015-02-21 DIAGNOSIS — R05 Cough: Secondary | ICD-10-CM

## 2015-02-21 DIAGNOSIS — J189 Pneumonia, unspecified organism: Secondary | ICD-10-CM

## 2015-02-21 DIAGNOSIS — Z792 Long term (current) use of antibiotics: Secondary | ICD-10-CM | POA: Insufficient documentation

## 2015-02-21 DIAGNOSIS — R059 Cough, unspecified: Secondary | ICD-10-CM

## 2015-02-21 DIAGNOSIS — J159 Unspecified bacterial pneumonia: Secondary | ICD-10-CM | POA: Insufficient documentation

## 2015-02-21 MED ORDER — KETOROLAC TROMETHAMINE 60 MG/2ML IM SOLN
60.0000 mg | Freq: Once | INTRAMUSCULAR | Status: DC
  Filled 2015-02-21: qty 2

## 2015-02-21 MED ORDER — ALBUTEROL SULFATE HFA 108 (90 BASE) MCG/ACT IN AERS
2.0000 | INHALATION_SPRAY | RESPIRATORY_TRACT | Status: AC
  Administered 2015-02-21: 2 via RESPIRATORY_TRACT
  Filled 2015-02-21: qty 6.7

## 2015-02-21 MED ORDER — NAPROXEN 250 MG PO TABS
500.0000 mg | ORAL_TABLET | Freq: Once | ORAL | Status: AC
  Administered 2015-02-21: 500 mg via ORAL
  Filled 2015-02-21: qty 2

## 2015-02-21 MED ORDER — HYDROCODONE-ACETAMINOPHEN 5-325 MG PO TABS
1.0000 | ORAL_TABLET | Freq: Once | ORAL | Status: AC
  Administered 2015-02-21: 1 via ORAL
  Filled 2015-02-21: qty 1

## 2015-02-21 MED ORDER — NAPROXEN 500 MG PO TABS: 500.0000 mg | ORAL_TABLET | Freq: Two times a day (BID) | ORAL | Status: DC

## 2015-02-21 NOTE — ED Provider Notes (Signed)
CSN: [id number]     Arrival date & time 02/21/15  1127 History   First MD Initiated Contact with Patient 02/21/15 1329     Chief Complaint  Patient presents with  . Hemoptysis  . Cough   (Consider location/radiation/quality/duration/timing/severity/associated sxs/prior Treatment) HPI  Chelsea Washington is a 44 year old female presenting with continued cough. She states she was seen for body aches and cough 4 days ago and diagnosed with pneumonia. She states her symptoms have improved however she still is coughing some. This morning she noticed bloody streaks in her sputum and became concerned.  She currently rates her pain as 8/10 in her chest when she coughs.  She denies any chest pain except with coughing, shortness of breath, leg swelling, recent surgeries or trauma, exogenous estrogen or history of PE or DVT.  Past Medical History  Diagnosis Date  . UTI (lower urinary tract infection)   . Hypertension    Past Surgical History  Procedure Laterality Date  . Wisdom tooth extraction    . No past surgeries     Family History  Problem Relation Age of Onset  . Hypertension Mother   . Asthma Sister   . Asthma Brother   . Hypertension Maternal Aunt   . Hypertension Maternal Uncle    History  Substance Use Topics  . Smoking status: Not on file  . Smokeless tobacco: Not on file  . Alcohol Use: No   OB History    No data available     Review of Systems  Constitutional: Negative for fever and chills.  HENT: Negative for sore throat.   Eyes: Negative for visual disturbance.  Respiratory: Positive for cough. Negative for shortness of breath.   Cardiovascular: Negative for chest pain and leg swelling.  Gastrointestinal: Negative for nausea, vomiting and diarrhea.  Genitourinary: Negative for dysuria.  Musculoskeletal: Positive for myalgias.  Skin: Negative for rash.  Neurological: Negative for weakness, numbness and headaches.      Allergies  Influenza vaccines; Peach; and  Penicillins  Home Medications   Prior to Admission medications   Medication Sig Start Date End Date Taking? Authorizing Provider  acetaminophen (TYLENOL) 500 MG tablet Take 500 mg by mouth every 6 (six) hours as needed (menstrual cramping).   Yes Historical Provider, MD  aspirin EC 81 MG tablet Take 81 mg by mouth daily.   Yes Historical Provider, MD  levofloxacin (LEVAQUIN) 750 MG tablet Take 1 tablet (750 mg total) by mouth once. Patient taking differently: Take 750 mg by mouth daily.  02/17/15  Yes Montine Circle, PA-C  lisinopril (PRINIVIL,ZESTRIL) 10 MG tablet Take 1 tablet (10 mg total) by mouth daily. 02/17/15 08/04/16 Yes Montine Circle, PA-C  metroNIDAZOLE (FLAGYL) 500 MG tablet Take 4 tablets (2,000 mg total) by mouth once. Patient not taking: Reported on 02/17/2015 11/24/14   Olegario Messier, NP  promethazine (PHENERGAN) 25 MG tablet Take 1 tablet (25 mg total) by mouth every 6 (six) hours as needed for nausea. Patient not taking: Reported on 02/17/2015 06/08/13   Manya Silvas, CNM   BP 137/83 mmHg  Pulse 83  Temp(Src) 98.1 F (36.7 C) (Oral)  Resp 20  Ht 5\' 1"  (1.549 m)  SpO2 97%  LMP 02/03/2015 Physical Exam  Constitutional: She appears well-developed and well-nourished. No distress.  HENT:  Head: Normocephalic and atraumatic.  Mouth/Throat: Oropharynx is clear and moist.  Eyes: Conjunctivae are normal.  Neck: Neck supple.  Cardiovascular: Normal rate, regular rhythm and intact distal pulses.   Pulmonary/Chest:  Effort normal. No respiratory distress. She has no decreased breath sounds. She has no wheezes. She has rhonchi in the right middle field and the left middle field. She has no rales. She exhibits no tenderness.  Abdominal: Soft. There is no tenderness.  Musculoskeletal: She exhibits no tenderness.  Lymphadenopathy:    She has no cervical adenopathy.  Neurological: She is alert.  Skin: Skin is warm and dry. No rash noted. She is not diaphoretic.  Psychiatric:  She has a normal mood and affect.  Nursing note and vitals reviewed.   ED Course  Procedures (including critical care time) Labs Review Labs Reviewed - No data to display  Imaging Review Dg Chest 2 View (if Patient Has Fever And/or Copd)  02/21/2015   CLINICAL DATA:  Cough.  Hemoptysis.  EXAM: CHEST  2 VIEW  COMPARISON:  02/17/2015 and 09/05/2011 and 01/14/2007  FINDINGS: There is a persistent area of vague density in the right lung base, probably in the right middle lobe. Vague area of density just above the left hemidiaphragm is unchanged as well. Heart size and vascularity are normal. No osseous abnormality. No effusions.  IMPRESSION: Small patchy areas of infiltrate in the right middle lobe and left lower lobe processed.   Electronically Signed   By: Lorriane Shire M.D.   On: 02/21/2015 14:09     EKG Interpretation None      MDM   Final diagnoses:  Cough  Community acquired pneumonia   44 yo with resolving pneumonia but concern over one episode of bloody streak after coughing.  Her CXR shows improving pneumonia.  She has no perc criteria and no other indication of concerning etiology.  Pt is well-appearing, in no acute distress and vital signs reviewed and not concerning. Discussed continuing abx. She appears safe to be discharged.  Discharge include resources to establish care  with a PCP.  Return precautions provided. Pt aware of plan and in agreement.    Filed Vitals:   02/21/15 1144 02/21/15 1348 02/21/15 1353 02/21/15 1513  BP: 139/92 137/83  152/93  Pulse: 81 83  72  Temp: 98.3 F (36.8 C) 98.1 F (36.7 C)  98.9 F (37.2 C)  TempSrc: Oral Oral  Oral  Resp: 18 20  18   Height: 5\' 1"  (1.549 m)     SpO2: 100% 95% 97% 100%   Meds given in ED:  Medications  naproxen (NAPROSYN) tablet 500 mg (500 mg Oral Given 02/21/15 1444)  HYDROcodone-acetaminophen (NORCO/VICODIN) 5-325 MG per tablet 1 tablet (1 tablet Oral Given 02/21/15 1446)  albuterol (PROVENTIL HFA;VENTOLIN HFA)  108 (90 BASE) MCG/ACT inhaler 2 puff (2 puffs Inhalation Given 02/21/15 1521)    Discharge Medication List as of 02/21/2015  3:09 PM    START taking these medications   Details  naproxen (NAPROSYN) 500 MG tablet Take 1 tablet (500 mg total) by mouth 2 (two) times daily., Starting 02/21/2015, Until Discontinued, Print           Britt Bottom, NP 02/22/15 8466  Lacretia Leigh, MD 02/23/15 1004

## 2015-02-21 NOTE — ED Notes (Signed)
Pt presents to ED C/O cough, lower back aches, chills, blood in sputum. Pt came to ED on Thursday and was told to return if she saw blood in her sputum. Pt states that amount was small and usually sputum is clear to yellow in color. Pt complains of pain only when she coughs and rates pain at 8/10.

## 2015-02-21 NOTE — ED Notes (Signed)
Pt c/o cough with some blood in sputum; pt sts diagnosed with PNA; pt sts some chills

## 2015-02-21 NOTE — Discharge Instructions (Signed)
Please follow the directions provided. Use the resource guide or the referral given to establish care with a primary care doctor to ensure you're getting better. The amount of blood in your sputum today is expected with your coughing from pneumonia. Ear exam and workup today is reassuring that nothing more dangerous is occurring. Your chest x-ray shows your pneumonia is improving so please continue taking your antibiotic until it's all gone. You may take the naproxen to help with any pain. Don't hesitate to return for any new, worsening, or concerning symptoms.   SEEK IMMEDIATE MEDICAL CARE IF:  You cough up bloody mucus for longer than a week.  You have a blood-producing cough that is severe or getting worse.  You have a blood-producing cough that comes and goes over time.  You develop problems with your breathing.  You vomit blood.  You develop bloody or black-colored stools.  You have chest pain.  You develop night sweats.  You feel faint or pass out.  You have a fever or persistent symptoms for more than 2-3 days.  You have a fever and your symptoms suddenly get worse.   Emergency Department Resource Guide 1) Find a Doctor and Pay Out of Pocket Although you won't have to find out who is covered by your insurance plan, it is a good idea to ask around and get recommendations. You will then need to call the office and see if the doctor you have chosen will accept you as a new patient and what types of options they offer for patients who are self-pay. Some doctors offer discounts or will set up payment plans for their patients who do not have insurance, but you will need to ask so you aren't surprised when you get to your appointment.  2) Contact Your Local Health Department Not all health departments have doctors that can see patients for sick visits, but many do, so it is worth a call to see if yours does. If you don't know where your local health department is, you can check in your phone  book. The CDC also has a tool to help you locate your state's health department, and many state websites also have listings of all of their local health departments.  3) Find a [facility] If your illness is not likely to be very severe or complicated, you may want to try a walk in clinic. These are popping up all over the country in pharmacies, drugstores, and shopping centers. They're usually staffed by nurse practitioners or physician assistants that have been trained to treat common illnesses and complaints. They're usually fairly quick and inexpensive. However, if you have serious medical issues or chronic medical problems, these are probably not your best option.  No Primary Care Doctor: - Call Health Connect at  [phone] - they can help you locate a primary care doctor that  accepts your insurance, provides certain services, etc. - Physician Referral Service- [phone]  Chronic Pain Problems: Organization         Address  Phone   Notes  [facility]  [phone] Patients need to be referred by their primary care doctor.   Medication Assistance: Organization         Address  Phone   Notes  [facility] Medication [facility] [address] [place], [place] [address] [phone] --Must be a resident of [facility] -- Must have NO insurance coverage whatsoever (no Medicaid/ Medicare, etc.) -- The pt. MUST have  a primary care doctor that directs their care regularly and follows them in the community   [facility]ist  [phone]   [employer]  [phone]    Agencies that provide inexpensive medical care: Organization         Address  Phone   Notes  [place]  [phone]   [facility]    [phone]   [facility] [place], [place] [address] [phone]   [place] [address], [place] [phone]   Planned  Parenthood    [phone]   [facility]    [phone]   [place] and [address], [place] Phone:  [phone], Fax:  [phone] Hours of Operation:  9 am - 6 pm, M-F.  Also accepts Medicaid/Medicare and self-pay.  [facility] for [place] [address], [address], [place] Phone: [phone], Fax: [phone]. Hours of Operation:  8:30 am - 5:30 pm, M-F.  Also accepts Medicaid and self-pay.  [facility] [place] [address], [place] Phone: [phone]   [place], [place], [place] [phone], Ext. [phone] Mondays & Thursdays: 7-9 AM.  First 15 patients are seen on a first come, first serve basis.    [place] Providers:  Organization         Address  Phone   Notes  [facility] [address], [place] [phone] Also accepts self-pay patients.  [facility] [address], [place]  [phone]   [place], [address], [place] [phone]   [facility] [address], [place] [phone]   Lucianne Lei [address], [place]   [phone] Only accepts [place] Access [place] patients after they have their name applied to their card.   Self-Pay (no insurance) in [facility]:  Organization         Address  Phone   Notes  Sickle Cell Patients, [facility] [place] [phone]   [facility] [place] [phone]   [facility] [place]  [place], [place], [place] [phone]   [facility]/Dr. Osei-Bonsu  [address] [place] or [address], [place] [phone] Phone number for both [place] and [place] locations is the same.    Urgent Medical and [facility] [address], [place] [phone]   [facility] [address] [place] or [address] [phone]   [facility] [address] [place] [phone], phone; [phone], fax Sees patients 1st and 3rd Saturday of every month.  Must not qualify for public or private insurance (i.e. Medicaid, Medicare, [place] Health Choice, Veterans' Benefits)  Household income should be no more than 200% of the poverty level The clinic cannot treat you if you are pregnant or think you are pregnant  Sexually transmitted diseases are not treated at the clinic.    Dental Care: Organization         Address  Phone  Notes  [facility] [place],  [place] [phone] Accepts children up to age 83 who are enrolled in Medicaid or [place]; pregnant women with a Medicaid card; and children who have applied for Medicaid or [place] Health Choice, but were declined, whose parents can pay a reduced fee at time of service.  [facility] Department of [facility]  [address], [place] [phone] Accepts children up to age 53 who are enrolled in [place] or [place]; pregnant women with a Medicaid card; and children who have applied for Medicaid or [place] Health Choice, but were declined, whose parents can pay a reduced fee at time of service.  [place] Adult Dental Access PROGRAM  [place] [phone] Patients are seen by appointment only. Walk-ins are not accepted. [place] will see patients 80 years of age and older. Monday - Tuesday (8am-5pm) Most Wednesdays (8:30-5pm) $30 per visit, cash only  [facility] Adult Dental Access PROGRAM  [address], [facility] [phone] Patients are seen by appointment only. Walk-ins are not accepted. [place] will see patients 72  years of age and older. One Wednesday Evening (Monthly: Volunteer Based).  $30 per visit, cash only  [place]  [phone] for adults; Children under age 57, call Graduate Pediatric Dentistry at [phone]. Children aged 40-14, please call [phone] to request a pediatric application.  Dental services are provided in all areas of dental care including fillings, crowns and bridges, complete and partial dentures, implants, gum treatment, root canals, and extractions. Preventive care is also provided. Treatment is provided to both adults and children. Patients are selected via a lottery and there is often a waiting list.   [facility] [address] [place]  [phone] [email or web address]   [facility] [address] [place], [place] [phone], Ext. [phone] Second and Fourth Thursday of each month, opens at 6:30 AM; Clinic ends at 9 AM.  Patients are seen on a first-come first-served basis, and a limited number are seen during each clinic.   [facility]  [address] Hillard Danker [place], [place] [phone]   Eligibility Requirements You must have lived in [place], [place], or [place] counties for at least the last three months.   You cannot be eligible for state or federal sponsored [employer], including [employer], [place], or [employer].   You generally cannot be eligible for healthcare insurance through your employer.    How to apply: Eligibility screenings are held every Tuesday and Wednesday afternoon from 1:00 pm until 4:00 pm. You do not need an appointment for the interview!  [facility] [address], [place], [place]   [place]  [place] Department  [place]  [phone]    Behavioral Health Resources in the Community: Intensive Outpatient  Programs Organization         Address  Phone  Notes  Rafael Hernandez [place]. [address] [place], [place] [phone]   [facility] [address] [place], [place]   ADS: Alcohol & Drug Svcs [address] [place], [place]   [place] [address],  [place], [place] or [phone]   Substance Abuse Resources Organization         Address  Phone  Notes  Alcohol and Drug Services  [place]  [phone]   [facility]   Chinita Pester  [phone]   Residential & Outpatient Substance Abuse Program  [phone]   Psychological Services Organization         Address  Phone  Notes  [place]  [place]  [phone]   [place] [address] [place] or [phone]    Mobile Crisis Teams Organization         Address  Phone  Notes  [facility], Mobile Crisis Care Unit  [phone]   [facility]ive Psychotherapeutic Services  [address] [place], [place]   [place] Levels [address], [place] [phone]    Self-Help/Support Groups Organization         Address  Phone             Notes  [place]. of [place] - variety of support groups  [place] Call for more information  Narcotics Anonymous (NA), [facility]aring [facility] [address], [employer] [place]  2 meetings at this location   Special educational needs teacher         Address  Phone  Notes  [facility] Residential Treatment [place],    [place]  [phone]   [facility]  [address], [place] [address], [place], [place]   [place], [place] [phone] Admissions: 8am-3pm M-F  Incentives Substance [place] [address],    [place], [place]  [phone]   [facility] [address] [place], [place], [place]   [facility] [address],  [place], [place]   [facility] Programs - Intensive Outpatient [address], [place], [place]   [facility] ([place].) [address].,  [place], [place] [phone] or [phone]   [facility] Services (RTS) [address], [place], [place] Accepts Medicaid  [facility] [place] [address]  [place] [phone] Substance Abuse/Addiction Treatment   [facility] Organization         Address  Phone  Notes  [facility]  [phone]   Domenic Schwab, PhD [address] Arlis Porta [place], [place]   [phone] or [phone]   [facility] [place], [place] [phone]   [facility] [address] [place], [place] [phone] Insurance/Medicaid/sponsorship through [facility] and Families [address]                                    [place], [place] [phone] [address], [place] [phone]    Dr. Adele Schilder  [phone]   [facility] [place] Dept. 1) [address], [place] 2) [place] 3)  [place], [place] [phone]  [phone]   [place] [phone] or [phone] (After Hours)

## 2015-03-04 ENCOUNTER — Ambulatory Visit: Payer: Self-pay | Attending: Internal Medicine | Admitting: Internal Medicine

## 2015-03-04 ENCOUNTER — Encounter: Payer: Self-pay | Admitting: Internal Medicine

## 2015-03-04 VITALS — BP 122/86 | HR 78 | Temp 98.0°F | Resp 16 | Wt 124.8 lb

## 2015-03-04 DIAGNOSIS — J189 Pneumonia, unspecified organism: Secondary | ICD-10-CM

## 2015-03-04 DIAGNOSIS — Z1231 Encounter for screening mammogram for malignant neoplasm of breast: Secondary | ICD-10-CM

## 2015-03-04 DIAGNOSIS — Z139 Encounter for screening, unspecified: Secondary | ICD-10-CM

## 2015-03-04 DIAGNOSIS — I10 Essential (primary) hypertension: Secondary | ICD-10-CM

## 2015-03-04 DIAGNOSIS — Z7982 Long term (current) use of aspirin: Secondary | ICD-10-CM | POA: Insufficient documentation

## 2015-03-04 DIAGNOSIS — Z791 Long term (current) use of non-steroidal anti-inflammatories (NSAID): Secondary | ICD-10-CM | POA: Insufficient documentation

## 2015-03-04 LAB — CBC WITH DIFFERENTIAL/PLATELET
Basophils Absolute: 0 10*3/uL (ref 0.0–0.1)
Basophils Relative: 1 % (ref 0–1)
EOS PCT: 0 % (ref 0–5)
Eosinophils Absolute: 0 10*3/uL (ref 0.0–0.7)
HEMATOCRIT: 35.2 % — AB (ref 36.0–46.0)
HEMOGLOBIN: 11.5 g/dL — AB (ref 12.0–15.0)
LYMPHS ABS: 1.4 10*3/uL (ref 0.7–4.0)
Lymphocytes Relative: 34 % (ref 12–46)
MCH: 32.4 pg (ref 26.0–34.0)
MCHC: 32.7 g/dL (ref 30.0–36.0)
MCV: 99.2 fL (ref 78.0–100.0)
MONO ABS: 0.4 10*3/uL (ref 0.1–1.0)
MONOS PCT: 10 % (ref 3–12)
MPV: 10.3 fL (ref 8.6–12.4)
Neutro Abs: 2.3 10*3/uL (ref 1.7–7.7)
Neutrophils Relative %: 55 % (ref 43–77)
Platelets: 351 10*3/uL (ref 150–400)
RBC: 3.55 MIL/uL — ABNORMAL LOW (ref 3.87–5.11)
RDW: 13.4 % (ref 11.5–15.5)
WBC: 4.2 10*3/uL (ref 4.0–10.5)

## 2015-03-04 LAB — COMPLETE METABOLIC PANEL WITH GFR
ALK PHOS: 57 U/L (ref 39–117)
ALT: 10 U/L (ref 0–35)
AST: 16 U/L (ref 0–37)
Albumin: 4 g/dL (ref 3.5–5.2)
BUN: 14 mg/dL (ref 6–23)
CO2: 24 mEq/L (ref 19–32)
CREATININE: 0.84 mg/dL (ref 0.50–1.10)
Calcium: 8.8 mg/dL (ref 8.4–10.5)
Chloride: 104 mEq/L (ref 96–112)
GFR, EST NON AFRICAN AMERICAN: 85 mL/min
Glucose, Bld: 85 mg/dL (ref 70–99)
POTASSIUM: 4.2 meq/L (ref 3.5–5.3)
Sodium: 140 mEq/L (ref 135–145)
Total Bilirubin: 0.5 mg/dL (ref 0.2–1.2)
Total Protein: 6.8 g/dL (ref 6.0–8.3)

## 2015-03-04 LAB — HEMOGLOBIN A1C
Hgb A1c MFr Bld: 5.1 % (ref <5.7)
MEAN PLASMA GLUCOSE: 100 mg/dL (ref <117)

## 2015-03-04 LAB — TSH: TSH: 1.007 u[IU]/mL (ref 0.350–4.500)

## 2015-03-04 NOTE — Progress Notes (Signed)
Patient Demographics  Chelsea Washington, is a 44 y.o. female  TMH:[id number]  [id number]  DOB - Dec 16, 1970  CC:  Chief Complaint  Patient presents with  . Establish Care    pneumonia       HPI: Chelsea Washington is a 44 y.o. female here today to establish medical care.2 weeks ago patient went to the emergency room and was diagnosed with pneumonia as per patient she completed the course of antibiotic currently denies any fever chills chest and shortness of breath, has occasional cough which is nonproductive, currently she's taking her blood pressure medication. Patient has No headache, No chest pain, No abdominal pain - No Nausea, No new weakness tingling or numbness, No Cough - SOB.  Allergies  Allergen Reactions  . Influenza Vaccines   . Peach [Prunus Persica] Hives  . Penicillins    Past Medical History  Diagnosis Date  . UTI (lower urinary tract infection)   . Hypertension    Current Outpatient Prescriptions on File Prior to Visit  Medication Sig Dispense Refill  . acetaminophen (TYLENOL) 500 MG tablet Take 500 mg by mouth every 6 (six) hours as needed (menstrual cramping).    Marland Kitchen aspirin EC 81 MG tablet Take 81 mg by mouth daily.    Marland Kitchen levofloxacin (LEVAQUIN) 750 MG tablet Take 1 tablet (750 mg total) by mouth once. (Patient taking differently: Take 750 mg by mouth daily. ) 6 tablet 0  . lisinopril (PRINIVIL,ZESTRIL) 10 MG tablet Take 1 tablet (10 mg total) by mouth daily. 30 tablet 0  . metroNIDAZOLE (FLAGYL) 500 MG tablet Take 4 tablets (2,000 mg total) by mouth once. (Patient not taking: Reported on 02/17/2015) 4 tablet 0  . naproxen (NAPROSYN) 500 MG tablet Take 1 tablet (500 mg total) by mouth 2 (two) times daily. 30 tablet 0  . promethazine (PHENERGAN) 25 MG tablet Take 1 tablet (25 mg total) by mouth every 6 (six) hours as needed for nausea. (Patient not taking: Reported on 02/17/2015) 10 tablet 0   No current facility-administered medications on file prior to visit.   Family  History  Problem Relation Age of Onset  . Hypertension Mother   . Asthma Sister   . Asthma Brother   . Hypertension Maternal Aunt   . Cancer Maternal Aunt     breast cancer  . Hypertension Maternal Uncle   . Cancer Maternal Grandmother   . Diabetes Maternal Grandmother   . Heart disease Maternal Grandfather    History   Social History  . Marital Status: Single    Spouse Name: N/A  . Number of Children: N/A  . Years of Education: N/A   Occupational History  . Not on file.   Social History Main Topics  . Smoking status: Not on file  . Smokeless tobacco: Not on file  . Alcohol Use: No  . Drug Use: No  . Sexual Activity: Not Currently    Birth Control/ Protection: None   Other Topics Concern  . Not on file   Social History Narrative    Review of Systems: Constitutional: Negative for fever, chills, diaphoresis, activity change, appetite change and fatigue. HENT: Negative for ear pain, nosebleeds, congestion, facial swelling, rhinorrhea, neck pain, neck stiffness and ear discharge.  Eyes: Negative for pain, discharge, redness, itching and visual disturbance. Respiratory: Negative for cough, choking, chest tightness, shortness of breath, wheezing and stridor.  Cardiovascular: Negative for chest pain, palpitations and leg swelling. Gastrointestinal: Negative for abdominal distention. Genitourinary: Negative for dysuria, urgency,  frequency, hematuria, flank pain, decreased urine volume, difficulty urinating and dyspareunia.  Musculoskeletal: Negative for back pain, joint swelling, arthralgia and gait problem. Neurological: Negative for dizziness, tremors, seizures, syncope, facial asymmetry, speech difficulty, weakness, light-headedness, numbness and headaches.  Hematological: Negative for adenopathy. Does not bruise/bleed easily. Psychiatric/Behavioral: Negative for hallucinations, behavioral problems, confusion, dysphoric mood, decreased concentration and agitation.     Objective:   Filed Vitals:   03/04/15 1055  BP: 122/86  Pulse: 78  Temp: 98 F (36.7 C)  Resp: 16    Physical Exam: Constitutional: Patient appears well-developed and well-nourished. No distress. HENT: Normocephalic, atraumatic, External right and left ear normal. Oropharynx is clear and moist.  Eyes: Conjunctivae and EOM are normal. PERRLA, no scleral icterus. Neck: Normal ROM. Neck supple. No JVD. No tracheal deviation. No thyromegaly. CVS: RRR, S1/S2 +, no murmurs, no gallops, no carotid bruit.  Pulmonary: Effort and breath sounds normal, no stridor, rhonchi, wheezes, rales.  Abdominal: Soft. BS +, no distension, tenderness, rebound or guarding.  Musculoskeletal: Normal range of motion. No edema and no tenderness.  Neuro: Alert. Normal reflexes, muscle tone coordination. No cranial nerve deficit. Skin: Skin is warm and dry. No rash noted. Not diaphoretic. No erythema. No pallor. Psychiatric: Normal mood and affect. Behavior, judgment, thought content normal.  Lab Results  Component Value Date   HGB 15.0 09/05/2011   HCT 44.0 09/05/2011   Lab Results  Component Value Date   CREATININE 0.90 09/05/2011   BUN 11 09/05/2011   NA 138 09/05/2011   K 3.6 09/05/2011   CL 102 09/05/2011    No results found for: HGBA1C Lipid Panel  No results found for: CHOL, TRIG, HDL, CHOLHDL, VLDL, LDLCALC     Assessment and plan:   1. CAP (community acquired pneumonia) Patient has completed the course of antibioticand symptomatically improved.  2. Essential hypertension Advised patient for DASH diet, continue with current meds - COMPLETE METABOLIC PANEL WITH GFR  3. Encounter for screening mammogram for breast cancer  - MM DIGITAL SCREENING BILATERAL; Future  4. Screening Ordered baseline blood work. - TSH - CBC with Differential/Platelet - Hemoglobin A1c - Vit D  25 hydroxy (rtn osteoporosis monitoring)        Health Maintenance  -Mammogram:ordered  Return in  about 3 months (around 06/03/2015), or if symptoms worsen or fail to improve, for hypertension.    The patient was given clear instructions to go to ER or return to medical center if symptoms don't improve, worsen or new problems develop. The patient verbalized understanding. The patient was told to call to get lab results if they haven't heard anything in the next week.    This note has been created with Surveyor, quantity. Any transcriptional errors are unintentional.   Lorayne Marek, MD

## 2015-03-04 NOTE — Patient Instructions (Signed)
DASH Eating Plan °DASH stands for "Dietary Approaches to Stop Hypertension." The DASH eating plan is a healthy eating plan that has been shown to reduce high blood pressure (hypertension). Additional health benefits may include reducing the risk of type 2 diabetes mellitus, heart disease, and stroke. The DASH eating plan may also help with weight loss. °WHAT DO I NEED TO KNOW ABOUT THE DASH EATING PLAN? °For the DASH eating plan, you will follow these general guidelines: °· Choose foods with a percent daily value for sodium of less than 5% (as listed on the food label). °· Use salt-free seasonings or herbs instead of table salt or sea salt. °· Check with your health care provider or pharmacist before using salt substitutes. °· Eat lower-sodium products, often labeled as "lower sodium" or "no salt added." °· Eat fresh foods. °· Eat more vegetables, fruits, and low-fat dairy products. °· Choose whole grains. Look for the word "whole" as the first word in the ingredient list. °· Choose fish and skinless chicken or turkey more often than red meat. Limit fish, poultry, and meat to 6 oz (170 g) each day. °· Limit sweets, desserts, sugars, and sugary drinks. °· Choose heart-healthy fats. °· Limit cheese to 1 oz (28 g) per day. °· Eat more home-cooked food and less restaurant, buffet, and fast food. °· Limit fried foods. °· Cook foods using methods other than frying. °· Limit canned vegetables. If you do use them, rinse them well to decrease the sodium. °· When eating at a restaurant, ask that your food be prepared with less salt, or no salt if possible. °WHAT FOODS CAN I EAT? °Seek help from a dietitian for individual calorie needs. °Grains °Whole grain or whole wheat bread. Brown rice. Whole grain or whole wheat pasta. Quinoa, bulgur, and whole grain cereals. Low-sodium cereals. Corn or whole wheat flour tortillas. Whole grain cornbread. Whole grain crackers. Low-sodium crackers. °Vegetables °Fresh or frozen vegetables  (raw, steamed, roasted, or grilled). Low-sodium or reduced-sodium tomato and vegetable juices. Low-sodium or reduced-sodium tomato sauce and paste. Low-sodium or reduced-sodium canned vegetables.  °Fruits °All fresh, canned (in natural juice), or frozen fruits. °Meat and Other Protein Products °Ground beef (85% or leaner), grass-fed beef, or beef trimmed of fat. Skinless chicken or turkey. Ground chicken or turkey. Pork trimmed of fat. All fish and seafood. Eggs. Dried beans, peas, or lentils. Unsalted nuts and seeds. Unsalted canned beans. °Dairy °Low-fat dairy products, such as skim or 1% milk, 2% or reduced-fat cheeses, low-fat ricotta or cottage cheese, or plain low-fat yogurt. Low-sodium or reduced-sodium cheeses. °Fats and Oils °Tub margarines without trans fats. Light or reduced-fat mayonnaise and salad dressings (reduced sodium). Avocado. Safflower, olive, or canola oils. Natural peanut or almond butter. °Other °Unsalted popcorn and pretzels. °The items listed above may not be a complete list of recommended foods or beverages. Contact your dietitian for more options. °WHAT FOODS ARE NOT RECOMMENDED? °Grains °White bread. White pasta. White rice. Refined cornbread. Bagels and croissants. Crackers that contain trans fat. °Vegetables °Creamed or fried vegetables. Vegetables in a cheese sauce. Regular canned vegetables. Regular canned tomato sauce and paste. Regular tomato and vegetable juices. °Fruits °Dried fruits. Canned fruit in light or heavy syrup. Fruit juice. °Meat and Other Protein Products °Fatty cuts of meat. Ribs, chicken wings, bacon, sausage, bologna, salami, chitterlings, fatback, hot dogs, bratwurst, and packaged luncheon meats. Salted nuts and seeds. Canned beans with salt. °Dairy °Whole or 2% milk, cream, half-and-half, and cream cheese. Whole-fat or sweetened yogurt. Full-fat   cheeses or blue cheese. Nondairy creamers and whipped toppings. Processed cheese, cheese spreads, or cheese  curds. °Condiments °Onion and garlic salt, seasoned salt, table salt, and sea salt. Canned and packaged gravies. Worcestershire sauce. Tartar sauce. Barbecue sauce. Teriyaki sauce. Soy sauce, including reduced sodium. Steak sauce. Fish sauce. Oyster sauce. Cocktail sauce. Horseradish. Ketchup and mustard. Meat flavorings and tenderizers. Bouillon cubes. Hot sauce. Tabasco sauce. Marinades. Taco seasonings. Relishes. °Fats and Oils °Butter, stick margarine, lard, shortening, ghee, and bacon fat. Coconut, palm kernel, or palm oils. Regular salad dressings. °Other °Pickles and olives. Salted popcorn and pretzels. °The items listed above may not be a complete list of foods and beverages to avoid. Contact your dietitian for more information. °WHERE CAN I FIND MORE INFORMATION? °National Heart, [employer], and [employer] Institute: www.nhlbi.nih.gov/health/health-topics/topics/dash/ °Document Released: 10/11/2011 Document Revised: 03/08/2014 Document Reviewed: 08/26/2013 °ExitCare® Patient Information ©2015 [employer]. This information is not intended to replace advice given to you by your health care provider. Make sure you discuss any questions you have with your health care provider. ° °

## 2015-03-04 NOTE — Progress Notes (Signed)
Patient here for follow up from the ED Patient was diagnosed with pneumonia Patient complains of still having a cough usually in the middle of the night But overall is feeling better

## 2015-03-05 LAB — VITAMIN D 25 HYDROXY (VIT D DEFICIENCY, FRACTURES): VIT D 25 HYDROXY: 22 ng/mL — AB (ref 30–100)

## 2015-03-07 ENCOUNTER — Other Ambulatory Visit: Payer: Self-pay | Admitting: Family Medicine

## 2015-03-07 DIAGNOSIS — E559 Vitamin D deficiency, unspecified: Secondary | ICD-10-CM | POA: Insufficient documentation

## 2015-03-07 MED ORDER — VITAMIN D3 50 MCG (2000 UT) PO TABS: 2000.0000 [IU] | ORAL_TABLET | Freq: Every day | ORAL | Status: DC

## 2015-03-08 ENCOUNTER — Telehealth: Payer: Self-pay

## 2015-03-08 MED ORDER — VITAMIN D (ERGOCALCIFEROL) 1.25 MG (50000 UNIT) PO CAPS: 50000.0000 [IU] | ORAL_CAPSULE | ORAL | Status: DC

## 2015-03-08 NOTE — Telephone Encounter (Signed)
-----   Message from Boykin Nearing, MD sent at 03/07/2015  2:16 PM EDT ----- Normal labs except for vit D insufficiency. Vit D ordered

## 2015-03-08 NOTE — Telephone Encounter (Signed)
Patient is aware of her lab results 

## 2015-03-09 ENCOUNTER — Telehealth: Payer: Self-pay

## 2015-03-09 ENCOUNTER — Ambulatory Visit: Payer: Self-pay | Attending: Internal Medicine

## 2015-03-09 NOTE — Telephone Encounter (Signed)
Patient called stating she had two prescriptions for vitamin D at the pharmacy The correct one should be vitamin D 2000 units Patient is aware and will pick up the prescription

## 2015-03-09 NOTE — Telephone Encounter (Signed)
Pt calling to clarify which Vit D prescription she should be taking because her pharmacy received two scripts. Please f/u pt.

## 2015-03-11 NOTE — Telephone Encounter (Signed)
Spoke with patient and she is aware she should be taking 2000 units

## 2016-02-07 ENCOUNTER — Encounter (HOSPITAL_COMMUNITY): Payer: Self-pay | Admitting: Emergency Medicine

## 2016-02-07 ENCOUNTER — Emergency Department (HOSPITAL_COMMUNITY)
Admission: EM | Admit: 2016-02-07 | Discharge: 2016-02-07 | Disposition: A | Discharge status: Home / Self Care | Payer: Self-pay | Attending: Emergency Medicine | Admitting: Emergency Medicine

## 2016-02-07 ENCOUNTER — Emergency Department (HOSPITAL_COMMUNITY): Payer: Self-pay

## 2016-02-07 DIAGNOSIS — Z3202 Encounter for pregnancy test, result negative: Secondary | ICD-10-CM | POA: Insufficient documentation

## 2016-02-07 DIAGNOSIS — Z79899 Other long term (current) drug therapy: Secondary | ICD-10-CM | POA: Insufficient documentation

## 2016-02-07 DIAGNOSIS — Z880 Allergy status to penicillin: Secondary | ICD-10-CM | POA: Insufficient documentation

## 2016-02-07 DIAGNOSIS — R112 Nausea with vomiting, unspecified: Secondary | ICD-10-CM | POA: Insufficient documentation

## 2016-02-07 DIAGNOSIS — Z87440 Personal history of urinary (tract) infections: Secondary | ICD-10-CM | POA: Insufficient documentation

## 2016-02-07 DIAGNOSIS — K5909 Other constipation: Secondary | ICD-10-CM | POA: Insufficient documentation

## 2016-02-07 DIAGNOSIS — Z7982 Long term (current) use of aspirin: Secondary | ICD-10-CM | POA: Insufficient documentation

## 2016-02-07 DIAGNOSIS — I10 Essential (primary) hypertension: Secondary | ICD-10-CM | POA: Insufficient documentation

## 2016-02-07 DIAGNOSIS — Z791 Long term (current) use of non-steroidal anti-inflammatories (NSAID): Secondary | ICD-10-CM | POA: Insufficient documentation

## 2016-02-07 DIAGNOSIS — R42 Dizziness and giddiness: Secondary | ICD-10-CM | POA: Insufficient documentation

## 2016-02-07 LAB — URINALYSIS, ROUTINE W REFLEX MICROSCOPIC
BILIRUBIN URINE: NEGATIVE
Glucose, UA: NEGATIVE mg/dL
KETONES UR: NEGATIVE mg/dL
LEUKOCYTES UA: NEGATIVE
NITRITE: NEGATIVE
PH: 5 (ref 5.0–8.0)
PROTEIN: NEGATIVE mg/dL
Specific Gravity, Urine: 1.03 (ref 1.005–1.030)

## 2016-02-07 LAB — COMPREHENSIVE METABOLIC PANEL
ALK PHOS: 55 U/L (ref 38–126)
ALT: 13 U/L — ABNORMAL LOW (ref 14–54)
ANION GAP: 8 (ref 5–15)
AST: 21 U/L (ref 15–41)
Albumin: 3.4 g/dL — ABNORMAL LOW (ref 3.5–5.0)
BILIRUBIN TOTAL: 0.6 mg/dL (ref 0.3–1.2)
BUN: 15 mg/dL (ref 6–20)
CALCIUM: 9.1 mg/dL (ref 8.9–10.3)
CO2: 26 mmol/L (ref 22–32)
Chloride: 109 mmol/L (ref 101–111)
Creatinine, Ser: 1.01 mg/dL — ABNORMAL HIGH (ref 0.44–1.00)
GFR calc Af Amer: >60 mL/min (ref >60)
GFR calc non Af Amer: >60 mL/min (ref >60)
Glucose, Bld: 80 mg/dL (ref 65–99)
Potassium: 3.9 mmol/L (ref 3.5–5.1)
Sodium: 143 mmol/L (ref 135–145)
TOTAL PROTEIN: 6.3 g/dL — AB (ref 6.5–8.1)

## 2016-02-07 LAB — POC OCCULT BLOOD, ED: FECAL OCCULT BLD: NEGATIVE

## 2016-02-07 LAB — CBC
HCT: 36.7 % (ref 36.0–46.0)
Hemoglobin: 11.8 g/dL — ABNORMAL LOW (ref 12.0–15.0)
MCH: 32.2 pg (ref 26.0–34.0)
MCHC: 32.2 g/dL (ref 30.0–36.0)
MCV: 100.3 fL — ABNORMAL HIGH (ref 78.0–100.0)
Platelets: 220 10*3/uL (ref 150–400)
RBC: 3.66 MIL/uL — ABNORMAL LOW (ref 3.87–5.11)
RDW: 12.3 % (ref 11.5–15.5)
WBC: 4.2 10*3/uL (ref 4.0–10.5)

## 2016-02-07 LAB — I-STAT BETA HCG BLOOD, ED (MC, WL, AP ONLY)

## 2016-02-07 LAB — URINE MICROSCOPIC-ADD ON

## 2016-02-07 LAB — LIPASE, BLOOD: Lipase: 21 U/L (ref 11–51)

## 2016-02-07 MED ORDER — FLEET ENEMA 7-19 GM/118ML RE ENEM
1.0000 | ENEMA | Freq: Once | RECTAL | Status: AC
  Administered 2016-02-07: 1 via RECTAL
  Filled 2016-02-07: qty 1

## 2016-02-07 NOTE — ED Notes (Signed)
Pt sts abd pain with no BM x 1 week; pt sts some vomiting

## 2016-02-07 NOTE — ED Provider Notes (Signed)
CSN: [id number]     Arrival date & time 02/07/16  1315 History  By signing my name below, I, Chelsea Washington, attest that this documentation has been prepared under the direction and in the presence of Debroah Baller, NP. Electronically Signed: Eustaquio Washington, ED Scribe. 02/07/2016. 3:37 PM.   Chief Complaint  Patient presents with  . Constipation  . Emesis   Patient is a 45 y.o. female presenting with constipation and vomiting. The history is provided by the patient. No language interpreter was used.  Constipation Severity:  Moderate Time since last bowel movement:  1 week Timing:  Constant Progression:  Unchanged Chronicity:  Recurrent Relieved by:  Nothing Worsened by:  Nothing tried Ineffective treatments:  None tried Associated symptoms: nausea and vomiting   Associated symptoms: no diarrhea and no fever   Emesis Associated symptoms: no diarrhea      HPI Comments: Chelsea Washington is a 45 y.o. female with PMHx HTN who presents to the Emergency Department complaining of constant constipation x 1 week. Pt has hx of constipation in the past but has not had prolonged constipation for this long in the past. Pt denies eating a numerous amount of fast food or bread. She reports a normal meal for her includes chicken or steak with squash and potatoes. No hx anemia in the past. She also complains of nausea, vomiting, and dizziness that she often has with menses.  Pt is currently on her period.    Past Medical History  Diagnosis Date  . UTI (lower urinary tract infection)   . Hypertension    Past Surgical History  Procedure Laterality Date  . Wisdom tooth extraction    . No past surgeries     Family History  Problem Relation Age of Onset  . Hypertension Mother   . Asthma Sister   . Asthma Brother   . Hypertension Maternal Aunt   . Cancer Maternal Aunt     breast cancer  . Hypertension Maternal Uncle   . Cancer Maternal Grandmother   . Diabetes Maternal Grandmother   . Heart  disease Maternal Grandfather    Social History  Substance Use Topics  . Smoking status: None  . Smokeless tobacco: None  . Alcohol Use: No   OB History    No data available     Review of Systems  Constitutional: Negative for fever.  Gastrointestinal: Positive for nausea, vomiting and constipation. Negative for diarrhea.  Neurological: Positive for dizziness.  All other systems reviewed and are negative.  Allergies  Influenza vaccines; Peach; and Penicillins  Home Medications   Prior to Admission medications   Medication Sig Start Date End Date Taking? Authorizing Provider  acetaminophen (TYLENOL) 500 MG tablet Take 500 mg by mouth every 6 (six) hours as needed (menstrual cramping).    Historical Provider, MD  albuterol (PROVENTIL HFA;VENTOLIN HFA) 108 (90 BASE) MCG/ACT inhaler Inhale 2 puffs into the lungs every 6 (six) hours as needed for wheezing or shortness of breath.    Historical Provider, MD  aspirin EC 81 MG tablet Take 81 mg by mouth daily.    Historical Provider, MD  Cholecalciferol (VITAMIN D3) 2000 UNITS TABS Take 2,000 Units by mouth daily. 03/07/15   Josalyn Funches, MD  levofloxacin (LEVAQUIN) 750 MG tablet Take 1 tablet (750 mg total) by mouth once. Patient taking differently: Take 750 mg by mouth daily.  02/17/15   Montine Circle, PA-C  lisinopril (PRINIVIL,ZESTRIL) 10 MG tablet Take 1 tablet (10 mg total) by mouth  daily. 02/17/15 08/04/16  Montine Circle, PA-C  metroNIDAZOLE (FLAGYL) 500 MG tablet Take 4 tablets (2,000 mg total) by mouth once. Patient not taking: Reported on 02/17/2015 11/24/14   Olegario Messier, NP  naproxen (NAPROSYN) 500 MG tablet Take 1 tablet (500 mg total) by mouth 2 (two) times daily. 02/21/15   Britt Bottom, NP  promethazine (PHENERGAN) 25 MG tablet Take 1 tablet (25 mg total) by mouth every 6 (six) hours as needed for nausea. Patient not taking: Reported on 02/17/2015 06/08/13   Manya Silvas, CNM   BP 138/96 mmHg  Pulse 81  Temp(Src)  98 F (36.7 C) (Oral)  Resp 18  SpO2 100%   Physical Exam  Constitutional: She is oriented to person, place, and time. She appears well-developed and well-nourished. No distress.  HENT:  Head: Normocephalic and atraumatic.  Eyes: Conjunctivae and EOM are normal.  Neck: Neck supple. No tracheal deviation present.  Cardiovascular: Normal rate, regular rhythm and normal heart sounds.   Pulmonary/Chest: Effort normal and breath sounds normal. No respiratory distress. She has no wheezes. She has no rales.  Abdominal: Soft. Bowel sounds are normal. There is tenderness in the left lower quadrant. There is no rigidity, no rebound, no guarding and no CVA tenderness.  Genitourinary: Rectum normal. Rectal exam shows no internal hemorrhoid, no fissure, no mass, no tenderness and anal tone normal. Guaiac negative stool.  Musculoskeletal: Normal range of motion.  Neurological: She is alert and oriented to person, place, and time.  Skin: Skin is warm and dry.  Psychiatric: She has a normal mood and affect. Her behavior is normal.  Nursing note and vitals reviewed.   ED Course  Procedures (including critical care time)  DIAGNOSTIC STUDIES: Oxygen Saturation is 99% on RA, normal by my interpretation.    COORDINATION OF CARE: 3:34 PM-Discussed treatment plan with pt at bedside and pt agreed to plan.   Labs Review Results for orders placed or performed during the hospital encounter of 02/07/16 (from the past 24 hour(s))  Lipase, blood     Status: None   Collection Time: 02/07/16  1:30 PM  Result Value Ref Range   Lipase 21 11 - 51 U/L  Comprehensive metabolic panel     Status: Abnormal   Collection Time: 02/07/16  1:30 PM  Result Value Ref Range   Sodium 143 135 - 145 mmol/L   Potassium 3.9 3.5 - 5.1 mmol/L   Chloride 109 101 - 111 mmol/L   CO2 26 22 - 32 mmol/L   Glucose, Bld 80 65 - 99 mg/dL   BUN 15 6 - 20 mg/dL   Creatinine, Ser 1.01 (H) 0.44 - 1.00 mg/dL   Calcium 9.1 8.9 - 10.3  mg/dL   Total Protein 6.3 (L) 6.5 - 8.1 g/dL   Albumin 3.4 (L) 3.5 - 5.0 g/dL   AST 21 15 - 41 U/L   ALT 13 (L) 14 - 54 U/L   Alkaline Phosphatase 55 38 - 126 U/L   Total Bilirubin 0.6 0.3 - 1.2 mg/dL   GFR calc non Af Amer >60 >60 mL/min   GFR calc Af Amer >60 >60 mL/min   Anion gap 8 5 - 15  CBC     Status: Abnormal   Collection Time: 02/07/16  1:30 PM  Result Value Ref Range   WBC 4.2 4.0 - 10.5 K/uL   RBC 3.66 (L) 3.87 - 5.11 MIL/uL   Hemoglobin 11.8 (L) 12.0 - 15.0 g/dL   HCT 36.7 36.0 -  46.0 %   MCV 100.3 (H) 78.0 - 100.0 fL   MCH 32.2 26.0 - 34.0 pg   MCHC 32.2 30.0 - 36.0 g/dL   RDW 12.3 11.5 - 15.5 %   Platelets 220 150 - 400 K/uL  I-Stat beta hCG blood, ED ([facility], [facility], [facility] only)     Status: None   Collection Time: 02/07/16  1:58 PM  Result Value Ref Range   I-stat hCG, quantitative <5.0 <5 mIU/mL   Comment 3          Urinalysis, Routine w reflex microscopic (not at [facility])     Status: Abnormal   Collection Time: 02/07/16  3:20 PM  Result Value Ref Range   Color, Urine AMBER (A) YELLOW   APPearance CLOUDY (A) CLEAR   Specific Gravity, Urine 1.030 1.005 - 1.030   pH 5.0 5.0 - 8.0   Glucose, UA NEGATIVE NEGATIVE mg/dL   Hgb urine dipstick LARGE (A) NEGATIVE   Bilirubin Urine NEGATIVE NEGATIVE   Ketones, ur NEGATIVE NEGATIVE mg/dL   Protein, ur NEGATIVE NEGATIVE mg/dL   Nitrite NEGATIVE NEGATIVE   Leukocytes, UA NEGATIVE NEGATIVE  Urine microscopic-add on     Status: Abnormal   Collection Time: 02/07/16  3:20 PM  Result Value Ref Range   Squamous Epithelial / LPF 0-5 (A) NONE SEEN   WBC, UA 0-5 0 - 5 WBC/hpf   RBC / HPF TOO NUMEROUS TO COUNT 0 - 5 RBC/hpf   Bacteria, UA RARE (A) NONE SEEN   Urine-Other MUCOUS PRESENT   POC occult blood, ED     Status: None   Collection Time: 02/07/16  4:08 PM  Result Value Ref Range   Fecal Occult Bld NEGATIVE NEGATIVE     Imaging Review Dg Abd Acute W/chest  02/07/2016  CLINICAL DATA:  Abdominal pain.  Constipation.  Vomiting.  EXAM: DG ABDOMEN ACUTE W/ 1V CHEST COMPARISON:  Two-view chest 02/21/2015. FINDINGS: The heart size is normal. Lungs are clear. Moderate stool is present in the transverse and descending colon. There is no obstruction. Multiple phleboliths are present within the anatomic pelvis. The axial skeleton is within normal limits. The small bowel gas pattern and ascending colon are normal. IMPRESSION: 1. Moderate stool in the transverse and descending colon without obstruction. 2. No acute cardiopulmonary disease. Electronically Signed   By: San Morelle M.D.   On: 02/07/2016 13:45   I have personally reviewed and evaluated these images and lab results as part of my medical decision-making.  Patient had good results with enema. Large amount of stool. Patient without pain or nausea after having stool.   MDM  45 y.o. female with nausea and abdominal pain with severe constipation x 1 week. Stable for d/c after good results of enema and no obstruction noted on x-ray. She will start a high fiber diet and f/u with her PCP. Patient denies any dizziness, pain or nausea at time of d/c.  Final diagnoses:  Other constipation   I personally performed the services described in this documentation, which was scribed in my presence. The recorded information has been reviewed and is accurate.      [place], [place] 02/07/16 [place], MD 02/08/16 [phone]

## 2016-06-20 IMAGING — CR DG CHEST 2V
2 series · 2 of 2 positions shown · non-contrast
Comparison: 02/17/2015 and 09/05/2011 and 01/14/2007

CLINICAL DATA: Cough.  Hemoptysis.

EXAM:
CHEST  2 VIEW

[w chest pa]
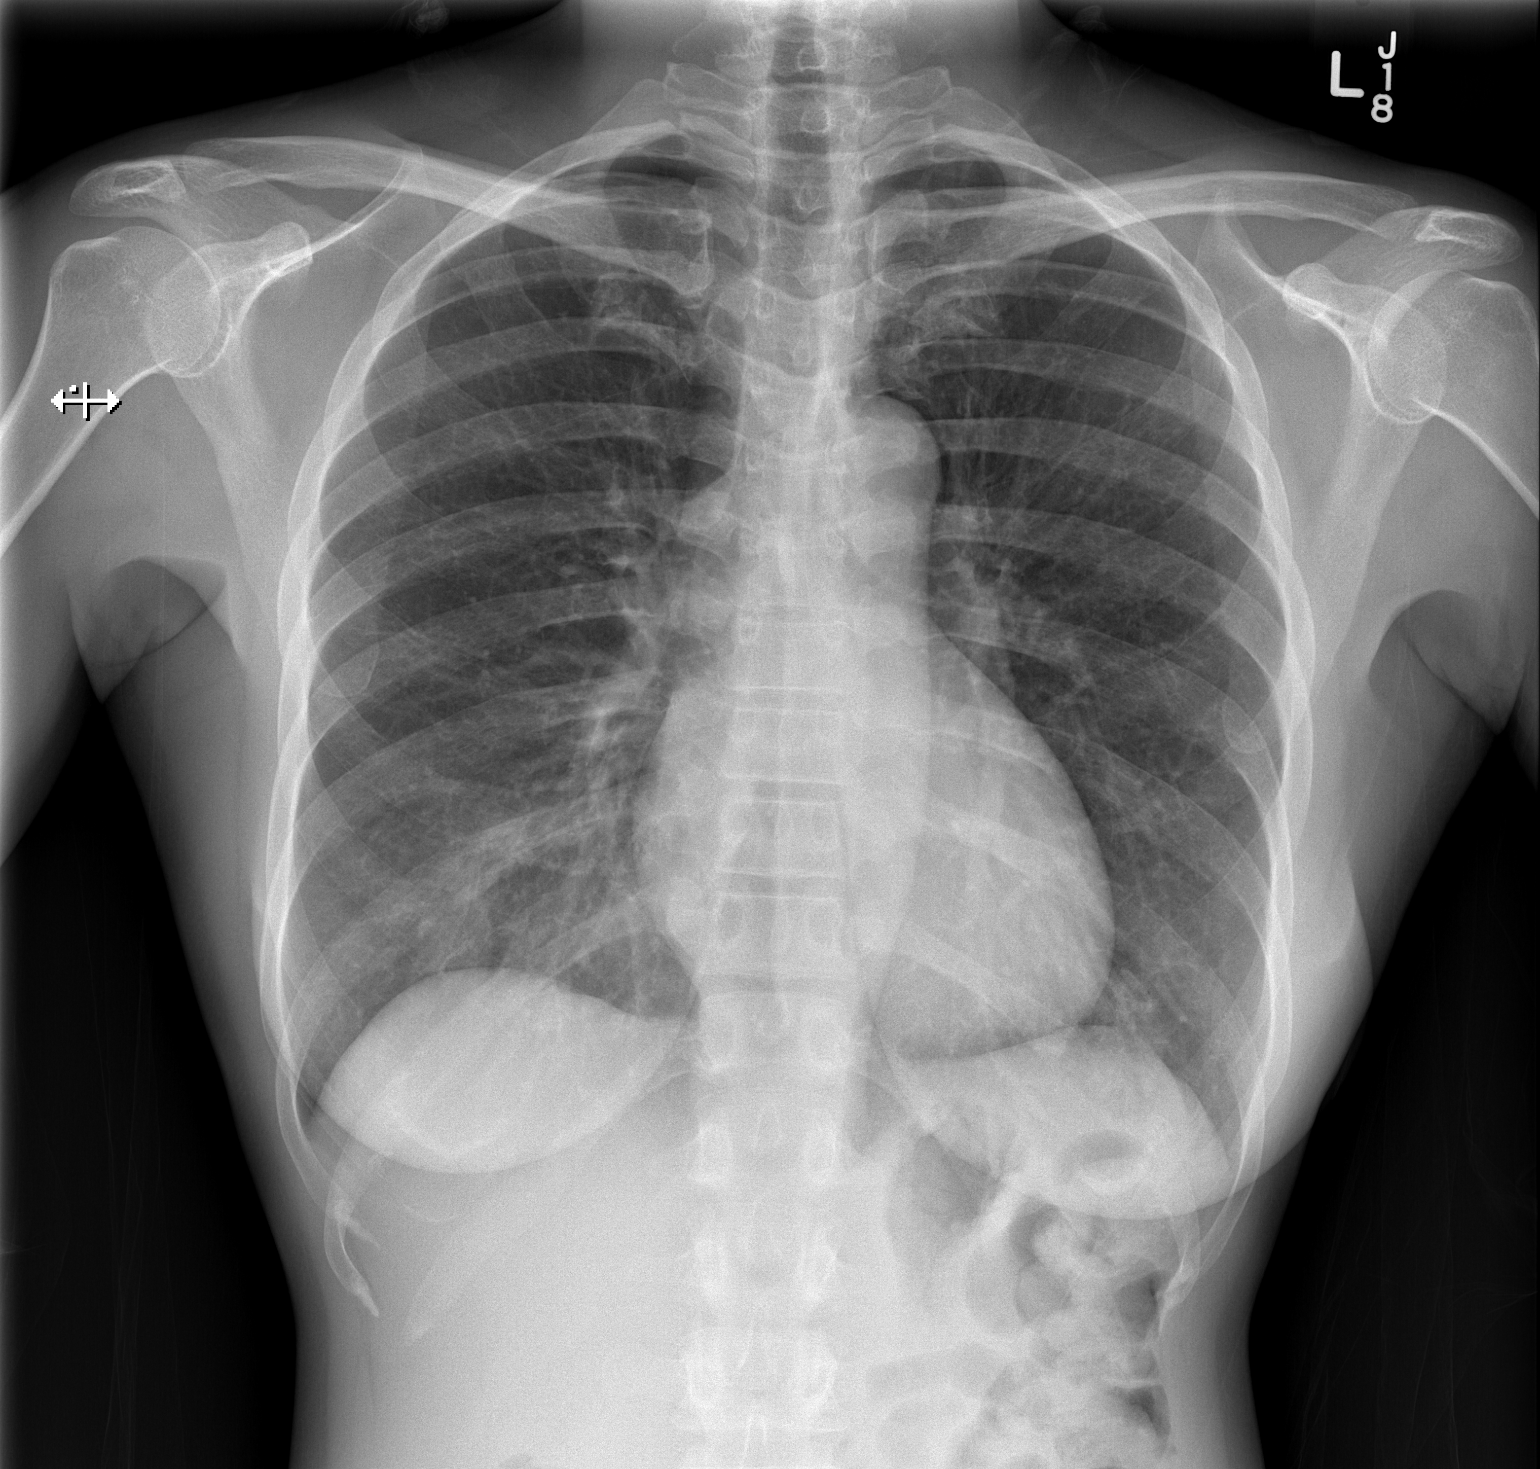

[w chest lat]
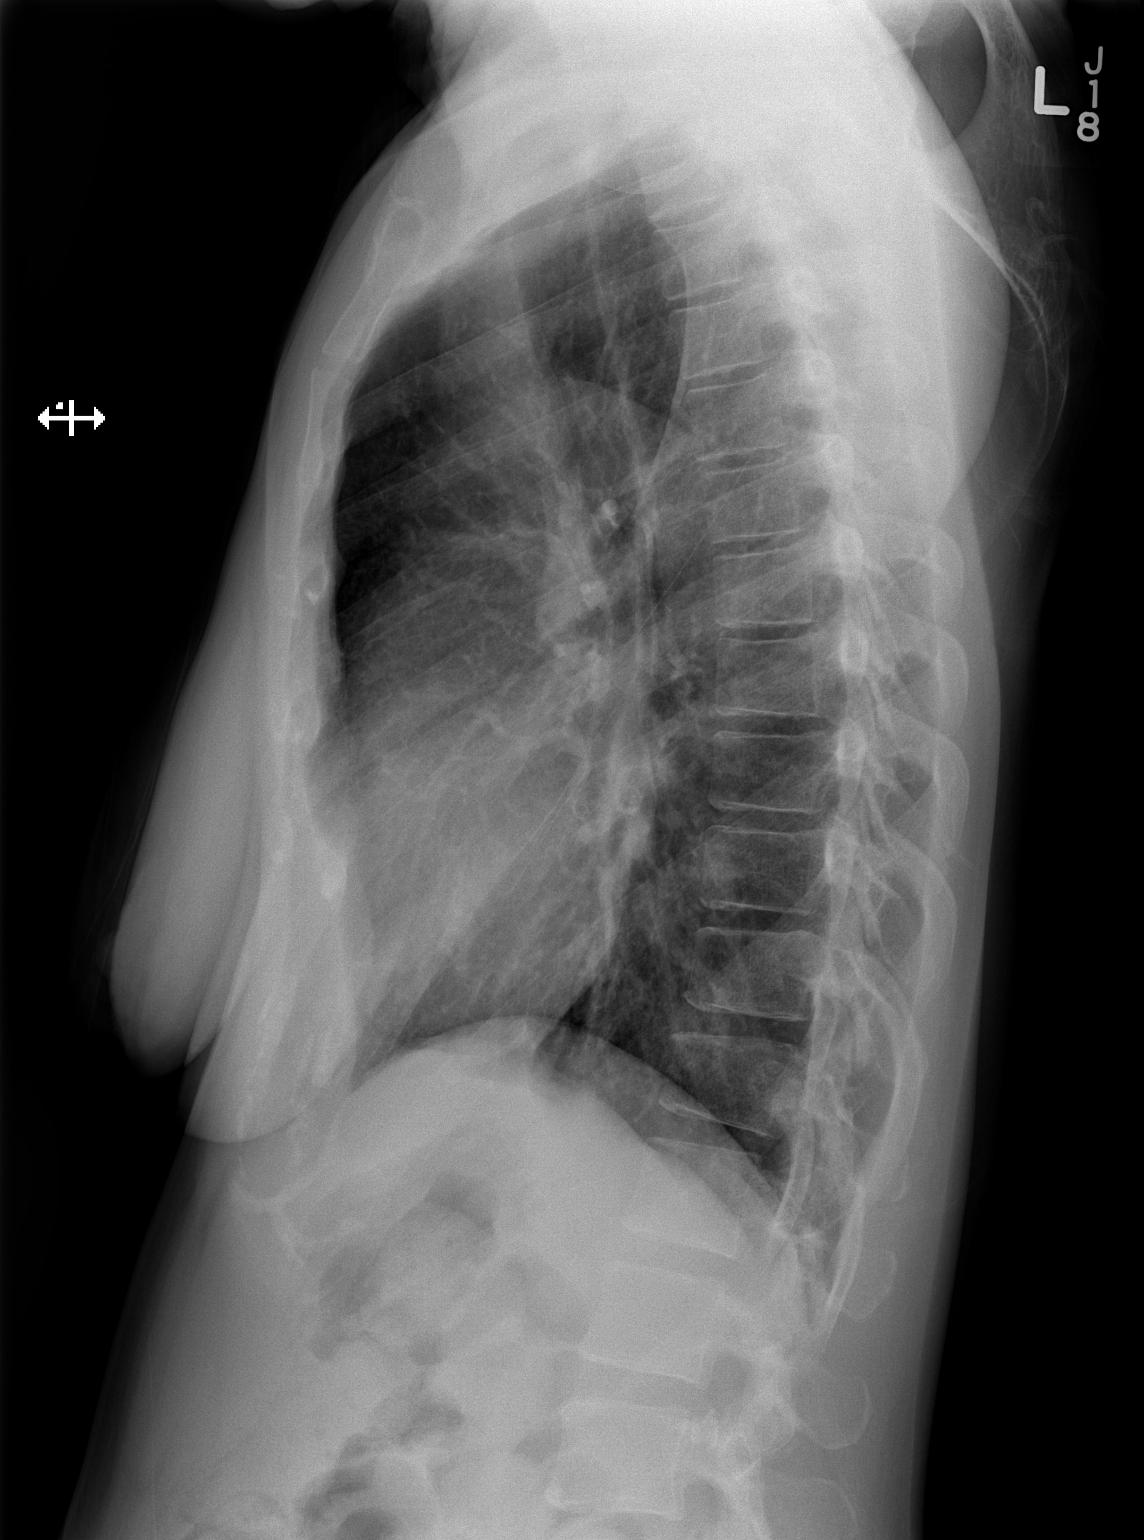

[2 of 2 positions shown; findings below may reference images not displayed]

FINDINGS: There is a persistent area of vague density in the right lung base,
probably in the right middle lobe. Vague area of density just above
the left hemidiaphragm is unchanged as well. Heart size and
vascularity are normal. No osseous abnormality. No effusions.
IMPRESSION: Small patchy areas of infiltrate in the right middle lobe and left
lower lobe processed.

## 2016-07-16 ENCOUNTER — Other Ambulatory Visit: Payer: Self-pay | Admitting: Obstetrics and Gynecology

## 2016-07-16 DIAGNOSIS — Z1231 Encounter for screening mammogram for malignant neoplasm of breast: Secondary | ICD-10-CM

## 2016-07-20 ENCOUNTER — Ambulatory Visit
Admission: RE | Admit: 2016-07-20 | Discharge: 2016-07-20 | Disposition: A | Discharge status: Home / Self Care | Payer: No Typology Code available for payment source | Source: Ambulatory Visit | Attending: Obstetrics and Gynecology | Admitting: Obstetrics and Gynecology

## 2016-07-20 ENCOUNTER — Ambulatory Visit (HOSPITAL_COMMUNITY)
Admission: RE | Admit: 2016-07-20 | Discharge: 2016-07-20 | Disposition: A | Discharge status: Home / Self Care | Payer: Self-pay | Source: Ambulatory Visit | Attending: Obstetrics and Gynecology | Admitting: Obstetrics and Gynecology

## 2016-07-20 ENCOUNTER — Encounter (HOSPITAL_COMMUNITY): Payer: Self-pay

## 2016-07-20 VITALS — BP 110/68 | Temp 98.1°F | Ht 64.5 in | Wt 130.3 lb

## 2016-07-20 DIAGNOSIS — Z01419 Encounter for gynecological examination (general) (routine) without abnormal findings: Secondary | ICD-10-CM

## 2016-07-20 DIAGNOSIS — Z1231 Encounter for screening mammogram for malignant neoplasm of breast: Secondary | ICD-10-CM

## 2016-07-20 NOTE — Patient Instructions (Addendum)
Explained breast self awareness to Chelsea Washington. Let her know [facility] will cover Pap smears and HPV typing every 5 years unless has a history of abnormal Pap smears. Referred patient to the [facility] for a screening mammogram. Appointment scheduled for Friday, July 20, 2016 at 1010. Let patient know will follow up with her within the next couple weeks with results of Pap smear by phone. Informed patient that [facility] will [facility] up with her within the next couple of weeks with results of mammogram by letter or phone. Discussed smoking cessation with patient. Referred to the [facility] Quit[facility] and gave resources to the free smoking cessation classes offered at [facility]. Chelsea Washington verbalized understanding.  Hakop Humbarger, Arvil Chaco, RN 10:07 AM

## 2016-07-20 NOTE — Progress Notes (Signed)
No complaints today.   Pap Smear: Pap smear completed today. Last Pap smear was 6-7 years ago at the free cervical cancer screening at [facility] and normal per patient. Per patient has no history of an abnormal Pap smear. No Pap smear results are in EPIC.  Physical exam: Breasts Breasts symmetrical. No skin abnormalities bilateral breasts. No nipple retraction bilateral breasts. No nipple discharge bilateral breasts. No lymphadenopathy. No lumps palpated bilateral breasts. No complaints of pain or tenderness on exam. Referred patient to the [facility] for a screening mammogram. Appointment scheduled for Friday, July 20, 2016 at 1010.  Pelvic/Bimanual   Ext Genitalia No lesions, no swelling and no discharge observed on external genitalia.         Vagina Vagina pink and normal texture. No lesions or discharge observed in vagina.          Cervix Cervix is present. Cervix pink and of normal texture. No discharge observed.     Uterus Uterus is present and palpable. Uterus in normal position and slightly enlarged. No complaints of AUB.        Adnexae Bilateral ovaries present and palpable. No tenderness on palpation.          Rectovaginal No rectal exam completed today since patient had no rectal complaints. No skin abnormalities observed on exam.    Smoking History: Patient is a current smoker. Discussed smoking cessation with patient. Referred to the [facility] Quitline and gave resources to the free smoking cessation classes offered at [facility].  Patient Navigation: Patient education provided. Access to services provided for patient through [facility] program.

## 2016-07-23 ENCOUNTER — Encounter (HOSPITAL_COMMUNITY): Payer: Self-pay | Admitting: *Deleted

## 2016-07-23 LAB — CYTOLOGY - PAP

## 2016-08-06 ENCOUNTER — Telehealth (HOSPITAL_COMMUNITY): Payer: Self-pay | Admitting: *Deleted

## 2016-08-06 NOTE — Telephone Encounter (Signed)
Telephoned patient at home number and discussed negative pap smear results. HPV was negative. Next pap smear due in five years. Patient voiced understanding.

## 2016-08-24 ENCOUNTER — Emergency Department (HOSPITAL_COMMUNITY)
Admission: EM | Admit: 2016-08-24 | Discharge: 2016-08-24 | Disposition: A | Discharge status: Home / Self Care | Payer: Self-pay | Attending: Emergency Medicine | Admitting: Emergency Medicine

## 2016-08-24 ENCOUNTER — Emergency Department (HOSPITAL_COMMUNITY): Payer: Self-pay

## 2016-08-24 ENCOUNTER — Ambulatory Visit (HOSPITAL_COMMUNITY)
Admission: EM | Admit: 2016-08-24 | Discharge: 2016-08-24 | Disposition: A | Discharge status: Home / Self Care | Payer: No Typology Code available for payment source | Attending: Family Medicine | Admitting: Family Medicine

## 2016-08-24 ENCOUNTER — Encounter (HOSPITAL_COMMUNITY): Payer: Self-pay | Admitting: Emergency Medicine

## 2016-08-24 ENCOUNTER — Encounter (HOSPITAL_COMMUNITY): Payer: Self-pay | Admitting: Nurse Practitioner

## 2016-08-24 DIAGNOSIS — I10 Essential (primary) hypertension: Secondary | ICD-10-CM | POA: Insufficient documentation

## 2016-08-24 DIAGNOSIS — R1031 Right lower quadrant pain: Secondary | ICD-10-CM

## 2016-08-24 DIAGNOSIS — Z79899 Other long term (current) drug therapy: Secondary | ICD-10-CM | POA: Insufficient documentation

## 2016-08-24 DIAGNOSIS — Z7982 Long term (current) use of aspirin: Secondary | ICD-10-CM | POA: Insufficient documentation

## 2016-08-24 DIAGNOSIS — D259 Leiomyoma of uterus, unspecified: Secondary | ICD-10-CM | POA: Insufficient documentation

## 2016-08-24 DIAGNOSIS — N12 Tubulo-interstitial nephritis, not specified as acute or chronic: Secondary | ICD-10-CM | POA: Insufficient documentation

## 2016-08-24 DIAGNOSIS — F17200 Nicotine dependence, unspecified, uncomplicated: Secondary | ICD-10-CM | POA: Insufficient documentation

## 2016-08-24 LAB — COMPREHENSIVE METABOLIC PANEL
ALT: 10 U/L — ABNORMAL LOW (ref 14–54)
ANION GAP: 7 (ref 5–15)
AST: 15 U/L (ref 15–41)
Albumin: 3.6 g/dL (ref 3.5–5.0)
Alkaline Phosphatase: 53 U/L (ref 38–126)
BUN: 14 mg/dL (ref 6–20)
CALCIUM: 8.9 mg/dL (ref 8.9–10.3)
CO2: 23 mmol/L (ref 22–32)
Chloride: 107 mmol/L (ref 101–111)
Creatinine, Ser: 0.78 mg/dL (ref 0.44–1.00)
GFR calc non Af Amer: >60 mL/min (ref >60)
Glucose, Bld: 102 mg/dL — ABNORMAL HIGH (ref 65–99)
POTASSIUM: 3.7 mmol/L (ref 3.5–5.1)
SODIUM: 137 mmol/L (ref 135–145)
Total Bilirubin: 0.5 mg/dL (ref 0.3–1.2)
Total Protein: 7 g/dL (ref 6.5–8.1)

## 2016-08-24 LAB — CBC
HCT: 35.4 % — ABNORMAL LOW (ref 36.0–46.0)
HEMOGLOBIN: 11.9 g/dL — AB (ref 12.0–15.0)
MCH: 32.8 pg (ref 26.0–34.0)
MCHC: 33.6 g/dL (ref 30.0–36.0)
MCV: 97.5 fL (ref 78.0–100.0)
PLATELETS: 206 10*3/uL (ref 150–400)
RBC: 3.63 MIL/uL — AB (ref 3.87–5.11)
RDW: 12.3 % (ref 11.5–15.5)
WBC: 10.4 10*3/uL (ref 4.0–10.5)

## 2016-08-24 LAB — POCT URINALYSIS DIP (DEVICE)
Bilirubin Urine: NEGATIVE
GLUCOSE, UA: NEGATIVE mg/dL
Ketones, ur: NEGATIVE mg/dL
NITRITE: NEGATIVE
Specific Gravity, Urine: >=1.03 (ref 1.005–1.030)
UROBILINOGEN UA: 0.2 mg/dL (ref 0.0–1.0)
pH: 6 (ref 5.0–8.0)

## 2016-08-24 LAB — POCT PREGNANCY, URINE: PREG TEST UR: NEGATIVE

## 2016-08-24 MED ORDER — HYDROMORPHONE HCL 2 MG/ML IJ SOLN
1.0000 mg | Freq: Once | INTRAMUSCULAR | Status: AC
  Administered 2016-08-24: 1 mg via INTRAVENOUS
  Filled 2016-08-24: qty 1

## 2016-08-24 MED ORDER — IOPAMIDOL (ISOVUE-300) INJECTION 61%
INTRAVENOUS | Status: AC
  Administered 2016-08-24: 100 mL
  Filled 2016-08-24: qty 100

## 2016-08-24 MED ORDER — CIPROFLOXACIN HCL 500 MG PO TABS: 500.0000 mg | ORAL_TABLET | Freq: Two times a day (BID) | ORAL | 0 refills | Status: DC

## 2016-08-24 MED ORDER — SODIUM CHLORIDE 0.9 % IV BOLUS (SEPSIS)
1000.0000 mL | Freq: Once | INTRAVENOUS | Status: AC
  Administered 2016-08-24: 1000 mL via INTRAVENOUS

## 2016-08-24 MED ORDER — HYDROMORPHONE HCL 2 MG/ML IJ SOLN
0.5000 mg | Freq: Once | INTRAMUSCULAR | Status: AC
  Administered 2016-08-24: 0.5 mg via INTRAVENOUS
  Filled 2016-08-24: qty 1

## 2016-08-24 MED ORDER — ONDANSETRON HCL 4 MG/2ML IJ SOLN
4.0000 mg | Freq: Once | INTRAMUSCULAR | Status: AC
Start: 2016-08-24 — End: 2016-08-24
  Administered 2016-08-24: 4 mg via INTRAVENOUS
  Filled 2016-08-24: qty 2

## 2016-08-24 MED ORDER — HYDROCODONE-ACETAMINOPHEN 5-325 MG PO TABS: 1.0000 | ORAL_TABLET | ORAL | 0 refills | Status: DC | PRN

## 2016-08-24 MED ORDER — CIPROFLOXACIN IN D5W 400 MG/200ML IV SOLN
400.0000 mg | Freq: Once | INTRAVENOUS | Status: AC
  Administered 2016-08-24: 400 mg via INTRAVENOUS
  Filled 2016-08-24: qty 200

## 2016-08-24 NOTE — Discharge Instructions (Signed)
YOU SHOULD BE SEEN IN THE EMERGENCY DEPARTMENT  THERE IS A CHANCE YOU MAY HAVE APPENDICITIS

## 2016-08-24 NOTE — ED Provider Notes (Signed)
CSN: [id number]     Arrival date & time 08/24/16  1330 History   First MD Initiated Contact with Patient 08/24/16 1410     Chief Complaint  Patient presents with  . Abdominal Pain   (Consider location/radiation/quality/duration/timing/severity/associated sxs/prior Treatment) HPI NP PT IS A 45 Y/O FEMALE WITH A 3 DAY HX OF ANOREXIA WITH ASSOC RLQ ABDO PAIN. PAIN INITIALLY WAS ACROSS THE SUPRAPUBIC AREA BUT NOW IS SETTLED IN THE RLQ. NOT EATING, NO VOMITING. STATES THAT MOVING AROUND MAKES HER SXS WORSE. SHE IS CURRENTLY HAVING HER PERIOD.  DOCES NOT THINK SHE IS PREGNANT.  Past Medical History:  Diagnosis Date  . Hypertension   . UTI (lower urinary tract infection)    Past Surgical History:  Procedure Laterality Date  . NO PAST SURGERIES    . WISDOM TOOTH EXTRACTION     Family History  Problem Relation Age of Onset  . Diabetes Maternal Grandmother   . Stroke Maternal Grandmother   . Heart disease Maternal Grandfather   . Hypertension Mother   . Asthma Mother   . Asthma Sister   . Asthma Brother   . Hypertension Maternal Aunt   . Cancer Maternal Aunt     breast cancer  . Hypertension Maternal Uncle    Social History  Substance Use Topics  . Smoking status: Current Every Day Smoker  . Smokeless tobacco: Never Used  . Alcohol use Yes     Comment: occassionally   OB History    Gravida Para Term Preterm AB Living   1 1 1     1    SAB TAB Ectopic Multiple Live Births           1     Review of Systems  Denies: HEADACHE, NAUSEA,  CHEST PAIN, CONGESTION, DYSURIA, SHORTNESS OF BREATH  Allergies  Influenza vaccines; Peach [prunus persica]; and Penicillins  Home Medications   Prior to Admission medications   Medication Sig Start Date End Date Taking? Authorizing Provider  lisinopril (PRINIVIL,ZESTRIL) 10 MG tablet Take 1 tablet (10 mg total) by mouth daily. 02/17/15 08/24/16 Yes Montine Circle, PA-C  acetaminophen (TYLENOL) 500 MG tablet Take 500 mg by mouth every 6  (six) hours as needed (menstrual cramping).    Historical Provider, MD  albuterol (PROVENTIL HFA;VENTOLIN HFA) 108 (90 BASE) MCG/ACT inhaler Inhale 2 puffs into the lungs every 6 (six) hours as needed for wheezing or shortness of breath.    Historical Provider, MD  aspirin EC 81 MG tablet Take 81 mg by mouth daily.    Historical Provider, MD  Cholecalciferol (VITAMIN D3) 2000 UNITS TABS Take 2,000 Units by mouth daily. Patient not taking: Reported on 07/20/2016 03/07/15   Boykin Nearing, MD  levofloxacin (LEVAQUIN) 750 MG tablet Take 1 tablet (750 mg total) by mouth once. Patient not taking: Reported on 07/20/2016 02/17/15   Montine Circle, PA-C  metroNIDAZOLE (FLAGYL) 500 MG tablet Take 4 tablets (2,000 mg total) by mouth once. Patient not taking: Reported on 07/20/2016 11/24/14   Olegario Messier, NP  naproxen (NAPROSYN) 500 MG tablet Take 1 tablet (500 mg total) by mouth 2 (two) times daily. Patient not taking: Reported on 07/20/2016 02/21/15   Britt Bottom, NP  promethazine (PHENERGAN) 25 MG tablet Take 1 tablet (25 mg total) by mouth every 6 (six) hours as needed for nausea. Patient not taking: Reported on 07/20/2016 06/08/13   Manya Silvas, CNM   Meds Ordered and Administered this Visit  Medications - No data to display  BP 148/98 (BP Location: Left Arm)   Pulse 84   Temp 100.4 F (38 C)   Resp 14   LMP 08/24/2016   SpO2 100%  No data found.   Physical Exam NURSES NOTES AND VITAL SIGNS REVIEWED. CONSTITUTIONAL: Well developed, well nourished, no acute distress HEENT: normocephalic, atraumatic EYES: Conjunctiva normal NECK:normal ROM, supple, no adenopathy PULMONARY:No respiratory distress, normal effort ABDOMINAL: Soft, ND, TENDER TO PALPATION RLQ. BS -'VE. , No CVAT MUSCULOSKELETAL: Normal ROM of all extremities,  SKIN: warm and dry without rash PSYCHIATRIC: Mood and affect, behavior are normal  Urgent Care Course   Clinical Course    Procedures (including critical  care time)  Labs Review Labs Reviewed  POCT URINALYSIS DIP (DEVICE) - Abnormal; Notable for the following:       Result Value   Hgb urine dipstick LARGE (*)    Protein, ur >=300 (*)    Leukocytes, UA LARGE (*)    All other components within normal limits  POCT PREGNANCY, URINE    Imaging Review No results found.   Visual Acuity Review  Right Eye Distance:   Left Eye Distance:   Bilateral Distance:    Right Eye Near:   Left Eye Near:    Bilateral Near:         MDM   1. Right lower quadrant abdominal pain    PT SHOULD GO TO ER FOR FURTHER EVALUATION.     Konrad Felix, Warrior 08/24/16 [phone]

## 2016-08-24 NOTE — ED Notes (Addendum)
Attempted to reassess pt and obtain another set of vitals. Pt stated, "If your not taking me back to a room then your not doing anything, they already know I feel". I attempted to inform that pt that it is important to reassess her to make sure that there have been no concerning changes that have accorded. Pt still answered with a "no".

## 2016-08-24 NOTE — Discharge Instructions (Signed)
You have been diagnosed with pyelonephritis.  Please see the attached information.  Your CT scan also showed a uterine fibroid.  Please discuss this with your OBGYN or make an appointment at [facility] in [place].

## 2016-08-24 NOTE — ED Triage Notes (Signed)
Pt presents with c/o abd pain. She was sent from [facility] for further evaluation of her pain. She reports onset 3 days ago. Pain has been increasingly worse since onset. She reports chills, decreased appetite, nausea. She denies vomiting, bowel or bladder changes. She is alert and breathing easily

## 2016-08-24 NOTE — ED Triage Notes (Signed)
The patient presented to the [facility] with a complaint of RLQ pain that radiates to her right flank and back. The patient denied any N/V/D and did report a bowel movement today.

## 2016-08-24 NOTE — ED Provider Notes (Signed)
[facility] DEPT Provider Note   CSN: [id number] Arrival date & time: 08/24/16  1457     History   Chief Complaint Chief Complaint  Patient presents with  . Abdominal Pain    HPI Zahniya Greenwalt is a 45 y.o. female.  Patient presents to the emergency department with chief complaint of right lower quadrant pain. She states that she has had the symptoms for the past 3 days. She denies any associated fevers or chills, but is noted to have a fever at urgent care today. She denies any nausea, vomiting, or dysuria. Her symptoms are worsened with movement and palpation. She denies any vaginal discharge. There are no other associated symptoms. She has not taken anything for her symptoms.   The history is provided by the patient. No language interpreter was used.    Past Medical History:  Diagnosis Date  . Hypertension   . UTI (lower urinary tract infection)     Patient Active Problem List   Diagnosis Date Noted  . Vitamin D insufficiency 03/07/2015  . Essential hypertension 03/04/2015  . Trichomonas vaginitis 06/08/2013    Past Surgical History:  Procedure Laterality Date  . NO PAST SURGERIES    . WISDOM TOOTH EXTRACTION      OB History    Gravida Para Term Preterm AB Living   1 1 1     1    SAB TAB Ectopic Multiple Live Births           1       Home Medications    Prior to Admission medications   Medication Sig Start Date End Date Taking? Authorizing Provider  acetaminophen (TYLENOL) 500 MG tablet Take 500 mg by mouth every 6 (six) hours as needed (menstrual cramping).    Historical Provider, MD  albuterol (PROVENTIL HFA;VENTOLIN HFA) 108 (90 BASE) MCG/ACT inhaler Inhale 2 puffs into the lungs every 6 (six) hours as needed for wheezing or shortness of breath.    Historical Provider, MD  aspirin EC 81 MG tablet Take 81 mg by mouth daily.    Historical Provider, MD  Cholecalciferol (VITAMIN D3) 2000 UNITS TABS Take 2,000 Units by mouth daily. Patient not taking:  Reported on 07/20/2016 03/07/15   Boykin Nearing, MD  levofloxacin (LEVAQUIN) 750 MG tablet Take 1 tablet (750 mg total) by mouth once. Patient not taking: Reported on 07/20/2016 02/17/15   Montine Circle, PA-C  lisinopril (PRINIVIL,ZESTRIL) 10 MG tablet Take 1 tablet (10 mg total) by mouth daily. 02/17/15 08/24/16  Montine Circle, PA-C  metroNIDAZOLE (FLAGYL) 500 MG tablet Take 4 tablets (2,000 mg total) by mouth once. Patient not taking: Reported on 07/20/2016 11/24/14   Olegario Messier, NP  naproxen (NAPROSYN) 500 MG tablet Take 1 tablet (500 mg total) by mouth 2 (two) times daily. Patient not taking: Reported on 07/20/2016 02/21/15   Britt Bottom, NP  promethazine (PHENERGAN) 25 MG tablet Take 1 tablet (25 mg total) by mouth every 6 (six) hours as needed for nausea. Patient not taking: Reported on 07/20/2016 06/08/13   Manya Silvas, CNM    Family History Family History  Problem Relation Age of Onset  . Diabetes Maternal Grandmother   . Stroke Maternal Grandmother   . Heart disease Maternal Grandfather   . Hypertension Mother   . Asthma Mother   . Asthma Sister   . Asthma Brother   . Hypertension Maternal Aunt   . Cancer Maternal Aunt     breast cancer  . Hypertension Maternal Uncle  Social History Social History  Substance Use Topics  . Smoking status: Current Every Day Smoker  . Smokeless tobacco: Never Used  . Alcohol use Yes     Comment: occassionally     Allergies   Influenza vaccines; Peach [prunus persica]; and Penicillins   Review of Systems Review of Systems  All other systems reviewed and are negative.    Physical Exam Updated Vital Signs BP 146/98 (BP Location: Right Arm)   Pulse 84   Temp 99.1 F (37.3 C) (Oral)   Resp 14   LMP 08/24/2016   SpO2 99%   Physical Exam  Constitutional: She is oriented to person, place, and time. She appears well-developed and well-nourished.  HENT:  Head: Normocephalic and atraumatic.  Eyes: Conjunctivae and  EOM are normal. Pupils are equal, round, and reactive to light.  Neck: Normal range of motion. Neck supple.  Cardiovascular: Normal rate and regular rhythm.  Exam reveals no gallop and no friction rub.   No murmur heard. Pulmonary/Chest: Effort normal and breath sounds normal. No respiratory distress. She has no wheezes. She has no rales. She exhibits no tenderness.  Abdominal: Soft. Bowel sounds are normal. She exhibits no distension and no mass. There is tenderness. There is no rebound and no guarding.  Right lower quadrant tender to palpation  Musculoskeletal: Normal range of motion. She exhibits no edema or tenderness.  Neurological: She is alert and oriented to person, place, and time.  Skin: Skin is warm and dry.  Psychiatric: She has a normal mood and affect. Her behavior is normal. Judgment and thought content normal.  Nursing note and vitals reviewed.    ED Treatments / Results  Labs (all labs ordered are listed, but only abnormal results are displayed) Labs Reviewed  COMPREHENSIVE METABOLIC PANEL - Abnormal; Notable for the following:       Result Value   Glucose, Bld 102 (*)    ALT 10 (*)    All other components within normal limits  CBC - Abnormal; Notable for the following:    RBC 3.63 (*)    Hemoglobin 11.9 (*)    HCT 35.4 (*)    All other components within normal limits    EKG  EKG Interpretation None       Radiology Ct Abdomen Pelvis W Contrast  Result Date: 08/24/2016 CLINICAL DATA:  45 year old female with history of right-sided abdominal pain for the past 3 nights, worsening yesterday evening. No associated nausea or vomiting. EXAM: CT ABDOMEN AND PELVIS WITH CONTRAST TECHNIQUE: Multidetector CT imaging of the abdomen and pelvis was performed using the standard protocol following bolus administration of intravenous contrast. CONTRAST:  12mL ISOVUE-300 IOPAMIDOL (ISOVUE-300) INJECTION 61% COMPARISON:  None. FINDINGS: Lower chest: Unremarkable.  Hepatobiliary: Multiple well-defined low-attenuation lesions scattered throughout the liver, the largest of which are all compatible with cysts. The largest single cyst is in segment 4B measuring 2.6 x 1.9 cm. Several sub cm low-attenuation lesions in the liver are too small to definitively characterize, but are statistically also likely to represent tiny cysts. No intra or extrahepatic biliary ductal dilatation. Gallbladder is normal in appearance. Pancreas: No pancreatic mass. No pancreatic ductal dilatation. No pancreatic or peripancreatic fluid or inflammatory changes. Spleen: Unremarkable. Adrenals/Urinary Tract: Left kidney is normal in appearance. The right kidney is remarkable for areas of apparent hypoperfusion resulting in somewhat of a striated nephrogram, concerning for right-sided pyelonephritis. No hydroureteronephrosis. Urinary bladder is normal in appearance. Bilateral adrenal glands are normal in appearance. Stomach/Bowel: Normal appearance of  the stomach. There is no pathologic dilatation of small bowel or colon. Normal appendix. Vascular/Lymphatic: No significant atherosclerotic disease, aneurysm or dissection identified in the abdominal or pelvic vasculature. Reproductive: The uterus is incredibly heterogeneous in appearance with multiple heterogeneously enhancing lesions, the largest of which is an exophytic lesion extending off the posterior aspect of the upper uterine body, measuring up to 7.5 x 10.9 x 13.8 cm. These are presumably multifocal fibroids. Ovaries are unremarkable in appearance. Other: Small volume of free fluid in the cul-de-sac, presumably physiologic. No larger volume of ascites. No pneumoperitoneum. Musculoskeletal: There are no aggressive appearing lytic or blastic lesions noted in the visualized portions of the skeleton. IMPRESSION: 1. Findings are concerning for right-sided pyelonephritis. Correlation with urinalysis is recommended. 2. Incidentally noted is a fibroid uterus  with numerous very large fibroids, as detailed above. Nonemergent outpatient referral for uterine artery embolization or surgical resection is suggested in the near future. 3. Normal appendix. 4. Multiple simple cysts throughout the liver. Electronically Signed   By: Vinnie Langton M.D.   On: 08/24/2016 18:48    Procedures Procedures (including critical care time)  Medications Ordered in ED Medications  HYDROmorphone (DILAUDID) injection 1 mg (not administered)  ondansetron (ZOFRAN) injection 4 mg (not administered)  sodium chloride 0.9 % bolus 1,000 mL (not administered)     Initial Impression / Assessment and Plan / ED Course  I have reviewed the triage vital signs and the nursing notes.  Pertinent labs & imaging results that were available during my care of the patient were reviewed by me and considered in my medical decision making (see chart for details).  Clinical Course    Patient with low-grade fever, right lower quadrant pain, quite tender to palpation. Sent from urgent care for further evaluation. Will check CT. Urine pregnancy negative from care. Urinalysis does show large amount hemoglobin, patient states she is currently on her period.  CT scan is consistent with pyelonephritis. I will treat patient with Cipro as she is allergic to penicillins. Will also recommend follow-up with OB/GYN given uterine fibroids seen on CT scan. This finding was discussed with the patient. Patient is tolerating oral intake. She is stable and ready for discharge. Final Clinical Impressions(s) / ED Diagnoses   Final diagnoses:  Pyelonephritis  Uterine leiomyoma, unspecified location    New Prescriptions New Prescriptions   CIPROFLOXACIN (CIPRO) 500 MG TABLET    Take 1 tablet (500 mg total) by mouth 2 (two) times daily.   HYDROCODONE-ACETAMINOPHEN (NORCO/VICODIN) 5-325 MG TABLET    Take 1-2 tablets by mouth every 4 (four) hours as needed.     Montine Circle, PA-C 08/24/16 2034      Davonna Belling, MD 08/24/16 [phone]

## 2017-07-10 ENCOUNTER — Other Ambulatory Visit: Payer: Self-pay | Admitting: Obstetrics and Gynecology

## 2017-07-10 DIAGNOSIS — Z1231 Encounter for screening mammogram for malignant neoplasm of breast: Secondary | ICD-10-CM

## 2017-07-30 ENCOUNTER — Ambulatory Visit (HOSPITAL_COMMUNITY)
Admission: RE | Admit: 2017-07-30 | Discharge: 2017-07-30 | Disposition: A | Discharge status: Home / Self Care | Payer: Self-pay | Source: Ambulatory Visit | Attending: Obstetrics and Gynecology | Admitting: Obstetrics and Gynecology

## 2017-07-30 ENCOUNTER — Encounter (HOSPITAL_COMMUNITY): Payer: Self-pay

## 2017-07-30 ENCOUNTER — Ambulatory Visit
Admission: RE | Admit: 2017-07-30 | Discharge: 2017-07-30 | Disposition: A | Discharge status: Home / Self Care | Payer: No Typology Code available for payment source | Source: Ambulatory Visit | Attending: Obstetrics and Gynecology | Admitting: Obstetrics and Gynecology

## 2017-07-30 VITALS — BP 120/84 | Temp 98.4°F | Ht 61.0 in | Wt 129.6 lb

## 2017-07-30 DIAGNOSIS — Z1231 Encounter for screening mammogram for malignant neoplasm of breast: Secondary | ICD-10-CM

## 2017-07-30 DIAGNOSIS — Z1239 Encounter for other screening for malignant neoplasm of breast: Secondary | ICD-10-CM

## 2017-07-30 NOTE — Progress Notes (Signed)
No complaints today.   Pap Smear: Pap smear not completed today. Last Pap smear was 07/20/2016 at [facility] and normal with negative HPV. Per patient has no history of an abnormal Pap smear. Last Pap smear result is in EPIC.  Physical exam: Breasts Breasts symmetrical. No skin abnormalities bilateral breasts. No nipple retraction bilateral breasts. No nipple discharge bilateral breasts. No lymphadenopathy. No lumps palpated bilateral breasts. No complaints of pain or tenderness on exam. Referred patient to the [facility] for a screening mammogram. Appointment scheduled for Tuesday, July 30, 2017 at 1540.  Pelvic/Bimanual No Pap smear completed today since last Pap smear and HPV typing was 07/20/2016. Pap smear not indicated per BCCCP guidelines.   Smoking History: Patient is a current smoker. Discussed smoking cessation with patient. Referred to the [facility] Quitline and gave resources to the free smoking cessation classes offered at [facility].  Patient Navigation: Patient education provided. Access to services provided for patient through [facility] program.

## 2017-07-30 NOTE — Patient Instructions (Signed)
Explained breast self awareness with Chelsea Washington. Patient did not need a Pap smear today due to last Pap smear and HPV typing was 07/20/2016. Let her know BCCCP will cover Pap smears and HPV typing every 5 years unless has a history of abnormal Pap smears. Referred patient to the [facility] for a screening mammogram. Appointment scheduled for Tuesday, July 30, 2017 at 1540. Let [facility] know [facility] will [facility] up with her within the next couple weeks with results of mammogram by letter or phone. Discussed smoking cessation with patient. Referred to the [facility] Quitline and gave resources to the free smoking cessation classes offered at [facility]. Chelsea Going Wallner verbalized understanding.  Cristina Ceniceros, [place]vil Ch[place], RN 4:00 PM

## 2017-08-02 ENCOUNTER — Encounter (HOSPITAL_COMMUNITY): Payer: Self-pay | Admitting: *Deleted

## 2018-01-27 ENCOUNTER — Encounter (HOSPITAL_COMMUNITY): Payer: Self-pay | Admitting: Family Medicine

## 2018-01-27 ENCOUNTER — Ambulatory Visit (HOSPITAL_COMMUNITY)
Admission: EM | Admit: 2018-01-27 | Discharge: 2018-01-27 | Disposition: A | Discharge status: Home / Self Care | Payer: No Typology Code available for payment source | Attending: Physician Assistant | Admitting: Physician Assistant

## 2018-01-27 DIAGNOSIS — R112 Nausea with vomiting, unspecified: Secondary | ICD-10-CM

## 2018-01-27 DIAGNOSIS — R197 Diarrhea, unspecified: Secondary | ICD-10-CM

## 2018-01-27 MED ORDER — ONDANSETRON 4 MG PO TBDP: 4.0000 mg | ORAL_TABLET | Freq: Three times a day (TID) | ORAL | 0 refills | Status: DC | PRN

## 2018-01-27 MED ORDER — ONDANSETRON 4 MG PO TBDP
ORAL_TABLET | ORAL | Status: AC
  Filled 2018-01-27: qty 1

## 2018-01-27 MED ORDER — ONDANSETRON 4 MG PO TBDP
4.0000 mg | ORAL_TABLET | Freq: Once | ORAL | Status: AC
  Administered 2018-01-27: 4 mg via ORAL

## 2018-01-27 NOTE — ED Triage Notes (Signed)
Pt here for vomiting and diarrhea since 7 am.

## 2018-01-27 NOTE — ED Provider Notes (Signed)
[name]    CSN: [id number] Arrival date & time: 01/27/18  1459     History   Chief Complaint Chief Complaint  Patient presents with  . Emesis    HPI Chelsea Washington is a 47 y.o. female.   47 year old female comes in for nausea, vomiting, diarrhea onset this morning. States has had 3-4 episodes/hour of watery diarrhea and emesis. Denies blood in vomit, blood in stool. Has been eating ice chips, otherwise has not kept anything else down. Denies fever, chills, night sweats. Denies URI symptoms such as rhinorrhea, nasal congestion, cough. Intermittent abdominal pain, periumbilical, improves with emesis/diarrhea. Denies recent use of antibiotics. Denies urinary symptoms such as frequency, dysuria, hematuria. Denies vaginal discharge, itching/pain, spotting. Has not taken anything for the symptoms.     Past Medical History:  Diagnosis Date  . Hypertension   . UTI (lower urinary tract infection)     Patient Active Problem List   Diagnosis Date Noted  . Vitamin D insufficiency 03/07/2015  . Essential hypertension 03/04/2015  . Trichomonas vaginitis 06/08/2013    Past Surgical History:  Procedure Laterality Date  . NO PAST SURGERIES    . WISDOM TOOTH EXTRACTION      OB History    Gravida  1   Para  1   Term  1   Preterm      AB      Living  1     SAB      TAB      Ectopic      Multiple      Live Births  1            Home Medications    Prior to Admission medications   Medication Sig Start Date End Date Taking? Authorizing Provider  acetaminophen (TYLENOL) 500 MG tablet Take 500 mg by mouth every 6 (six) hours as needed (menstrual cramping).    [provider]  albuterol (PROVENTIL HFA;VENTOLIN HFA) 108 (90 BASE) MCG/ACT inhaler Inhale 2 puffs into the lungs every 6 (six) hours as needed for wheezing or shortness of breath.    [provider]  aspirin EC 81 MG tablet Take 81 mg by mouth daily.    [provider]  Cholecalciferol (VITAMIN D3) 2000 UNITS TABS Take 2,000 Units by mouth daily. Patient not taking: Reported on 07/20/2016 03/07/15   Boykin Nearing, MD  lisinopril (PRINIVIL,ZESTRIL) 10 MG tablet Take 1 tablet (10 mg total) by mouth daily. 02/17/15 08/24/16  Montine Circle, PA-C  lisinopril (PRINIVIL,ZESTRIL) 10 MG tablet Take 10 mg by mouth daily.    [provider]  naproxen (NAPROSYN) 500 MG tablet Take 1 tablet (500 mg total) by mouth 2 (two) times daily. Patient not taking: Reported on 07/20/2016 02/21/15   Britt Bottom, NP  ondansetron (ZOFRAN ODT) 4 MG disintegrating tablet Take 1 tablet (4 mg total) by mouth every 8 (eight) hours as needed for nausea or vomiting. 01/27/18   Tasia Catchings, Amy V, PA-C  promethazine (PHENERGAN) 25 MG tablet Take 1 tablet (25 mg total) by mouth every 6 (six) hours as needed for nausea. Patient not taking: Reported on 07/20/2016 06/08/13   Manya Silvas, CNM    Family History Family History  Problem Relation Age of Onset  . Diabetes Maternal Grandmother   . Stroke Maternal Grandmother   . Heart disease Maternal Grandfather   . Hypertension Mother   . Asthma Mother   . Asthma Sister   . Hypertension Maternal Aunt   .  Cancer Maternal Aunt        breast cancer  . Hypertension Maternal Uncle     Social History Social History   Tobacco Use  . Smoking status: Current Some Day Smoker  . Smokeless tobacco: Never Used  Substance Use Topics  . Alcohol use: Yes    Comment: occassionally  . Drug use: Yes    Frequency: 2.0 times per week    Types: Marijuana     Allergies   Influenza vaccines; Peach [prunus persica]; and Penicillins   Review of Systems Review of Systems  Reason unable to perform ROS: See HPI as above.     Physical Exam Triage Vital Signs ED Triage Vitals [01/27/18 1640]  Enc Vitals Group     BP 115/86     Pulse Rate 86     Resp 18     Temp 98.3 F (36.8 C)     Temp src      SpO2 100 %     Weight        Height      Head Circumference      Peak Flow      Pain Score      Pain Loc      Pain Edu?      Excl. in [place]?    No data found.  Updated Vital Signs BP 115/86   Pulse 86   Temp 98.3 F (36.8 C)   Resp 18   LMP 01/10/2018   SpO2 100%   Physical Exam  Constitutional: She is oriented to person, place, and time. She appears well-developed and well-nourished. No distress.  HENT:  Head: Normocephalic and atraumatic.  Eyes: Pupils are equal, round, and reactive to light. Conjunctivae are normal.  Cardiovascular: Normal rate, regular rhythm and normal heart sounds. Exam reveals no gallop and no friction rub.  No murmur heard. Pulmonary/Chest: Effort normal and breath sounds normal. She has no wheezes. She has no rales.  Abdominal: Soft. She exhibits no mass. Bowel sounds are increased. There is tenderness (mild tenderness to suprapubic region). There is no rebound, no guarding and no CVA tenderness.  Neurological: She is alert and oriented to person, place, and time.  Skin: Skin is warm and dry.  Psychiatric: She has a normal mood and affect. Her behavior is normal. Judgment normal.     UC Treatments / Results  Labs (all labs ordered are listed, but only abnormal results are displayed) Labs Reviewed - No data to display  EKG None Radiology No results found.  Procedures Procedures (including critical care time)  Medications Ordered in UC Medications - No data to display   Initial Impression / Assessment and Plan / UC Course  I have reviewed the triage vital signs and the nursing notes.  Pertinent labs & imaging results that were available during my care of the patient were reviewed by me and considered in my medical decision making (see chart for details).    Discussed with patient no alarming signs on exam. Zofran for nausea. Push fluids. Bland diet, advance as tolerated. Return precautions given.  Final Clinical Impressions(s) / UC Diagnoses   Final  diagnoses:  Nausea vomiting and diarrhea    ED Discharge Orders        Ordered    ondansetron (ZOFRAN ODT) 4 MG disintegrating tablet  Every 8 hours PRN     01/27/18 1707        Ok Edwards, PA-C 01/27/18 1714

## 2018-01-27 NOTE — Discharge Instructions (Addendum)
Zofran for nausea and vomiting as needed. Keep hydrated, you urine should be clear to pale yellow in color. Bland diet as attached, advance as tolerated.  Monitor for any worsening of symptoms, nausea or vomiting not controlled by medication, worsening abdominal pain, fever, blood in stool/vomit, unwilling to jump up and down due to the pain, go to the emergency department for further evaluation.

## 2018-09-03 ENCOUNTER — Encounter (HOSPITAL_COMMUNITY): Payer: Self-pay | Admitting: Emergency Medicine

## 2018-09-03 ENCOUNTER — Ambulatory Visit (HOSPITAL_COMMUNITY)
Admission: EM | Admit: 2018-09-03 | Discharge: 2018-09-03 | Disposition: A | Discharge status: Home / Self Care | Payer: Self-pay | Attending: Family Medicine | Admitting: Family Medicine

## 2018-09-03 DIAGNOSIS — J22 Unspecified acute lower respiratory infection: Secondary | ICD-10-CM

## 2018-09-03 MED ORDER — AZITHROMYCIN 250 MG PO TABS: 250.0000 mg | ORAL_TABLET | Freq: Every day | ORAL | 0 refills | Status: DC

## 2018-09-03 MED ORDER — IBUPROFEN 800 MG PO TABS: 800.0000 mg | ORAL_TABLET | Freq: Three times a day (TID) | ORAL | 0 refills | Status: DC

## 2018-09-03 MED ORDER — BENZONATATE 100 MG PO CAPS: 100.0000 mg | ORAL_CAPSULE | Freq: Three times a day (TID) | ORAL | 0 refills | Status: DC

## 2018-09-03 MED ORDER — IPRATROPIUM BROMIDE 0.06 % NA SOLN: 2.0000 | Freq: Four times a day (QID) | NASAL | 0 refills | Status: AC

## 2018-09-03 NOTE — Discharge Instructions (Signed)
As discussed, given some crackles in your right lower lobe, I am covering you for bacterial infection, start azithromycin as directed. Tessalon for cough. Start atrovent nasal spray for nasal congestion/drainage. You can use over the counter nasal saline rinse such as neti pot for nasal congestion. Keep hydrated, your urine should be clear to pale yellow in color. Tylenol/motrin for fever and pain. Monitor for any worsening of symptoms, chest pain, shortness of breath, wheezing, swelling of the throat, follow up for reevaluation.   For sore throat/cough try using a honey-based tea. Use 3 teaspoons of honey with juice squeezed from half lemon. Place shaved pieces of ginger into 1/2-1 cup of water and warm over stove top. Then mix the ingredients and repeat every 4 hours as needed.

## 2018-09-03 NOTE — ED Provider Notes (Signed)
[place]    CSN: [id number] Arrival date & time: 09/03/18  1101     History   Chief Complaint Chief Complaint  Patient presents with  . Cough    HPI Chelsea Washington is a 47 y.o. female.   47 year old female with history of hypertension comes in for 3-4 day history of URI symptoms. Has had cough, mild congestion. States cough is productive, and last night started having body aches and hot/cold flashes. Subjective fever. She has not taken any medicine for this. Current some day smoker.      Past Medical History:  Diagnosis Date  . Hypertension   . UTI (lower urinary tract infection)     Patient Active Problem List   Diagnosis Date Noted  . Vitamin D insufficiency 03/07/2015  . Essential hypertension 03/04/2015  . Trichomonas vaginitis 06/08/2013    Past Surgical History:  Procedure Laterality Date  . NO PAST SURGERIES    . WISDOM TOOTH EXTRACTION      OB History    Gravida  1   Para  1   Term  1   Preterm      AB      Living  1     SAB      TAB      Ectopic      Multiple      Live Births  1            Home Medications    Prior to Admission medications   Medication Sig Start Date End Date Taking? Authorizing Provider  IRON PO Take by mouth.   Yes [provider]  acetaminophen (TYLENOL) 500 MG tablet Take 500 mg by mouth every 6 (six) hours as needed (menstrual cramping).    [provider]  albuterol (PROVENTIL HFA;VENTOLIN HFA) 108 (90 BASE) MCG/ACT inhaler Inhale 2 puffs into the lungs every 6 (six) hours as needed for wheezing or shortness of breath.    [provider]  aspirin EC 81 MG tablet Take 81 mg by mouth daily.    [provider]  azithromycin (ZITHROMAX) 250 MG tablet Take 1 tablet (250 mg total) by mouth daily. Take first 2 tablets together, then 1 every day until finished. 09/03/18   Tasia Catchings, Amy V, PA-C  benzonatate (TESSALON) 100 MG capsule Take 1 capsule (100 mg total) by  mouth every 8 (eight) hours. 09/03/18   Tasia Catchings, Amy V, PA-C  Cholecalciferol (VITAMIN D3) 2000 UNITS TABS Take 2,000 Units by mouth daily. Patient not taking: Reported on 07/20/2016 03/07/15   Boykin Nearing, MD  ibuprofen (ADVIL,MOTRIN) 800 MG tablet Take 1 tablet (800 mg total) by mouth 3 (three) times daily. 09/03/18   Yu, Amy V, PA-C  ipratropium (ATROVENT) 0.06 % nasal spray Place 2 sprays into both nostrils 4 (four) times daily. 09/03/18   Tasia Catchings, Amy V, PA-C  lisinopril (PRINIVIL,ZESTRIL) 10 MG tablet Take 1 tablet (10 mg total) by mouth daily. 02/17/15 08/24/16  Montine Circle, PA-C  lisinopril (PRINIVIL,ZESTRIL) 10 MG tablet Take 10 mg by mouth daily.    [provider]  naproxen (NAPROSYN) 500 MG tablet Take 1 tablet (500 mg total) by mouth 2 (two) times daily. Patient not taking: Reported on 07/20/2016 02/21/15   Britt Bottom, NP  ondansetron (ZOFRAN ODT) 4 MG disintegrating tablet Take 1 tablet (4 mg total) by mouth every 8 (eight) hours as needed for nausea or vomiting. Patient not taking: Reported on 09/03/2018 01/27/18   Tasia Catchings,  Amy V, PA-C  promethazine (PHENERGAN) 25 MG tablet Take 1 tablet (25 mg total) by mouth every 6 (six) hours as needed for nausea. Patient not taking: Reported on 07/20/2016 06/08/13   Manya Silvas, CNM    Family History Family History  Problem Relation Age of Onset  . Diabetes Maternal Grandmother   . Stroke Maternal Grandmother   . Heart disease Maternal Grandfather   . Hypertension Mother   . Asthma Mother   . Asthma Sister   . Hypertension Maternal Aunt   . Cancer Maternal Aunt        breast cancer  . Hypertension Maternal Uncle     Social History Social History   Tobacco Use  . Smoking status: Current Some Day Smoker  . Smokeless tobacco: Never Used  Substance Use Topics  . Alcohol use: Yes    Comment: occassionally  . Drug use: Yes    Frequency: 2.0 times per week    Types: Marijuana     Allergies   Influenza vaccines; Peach  [prunus persica]; and Penicillins   Review of Systems Review of Systems  Reason unable to perform ROS: See HPI as above.     Physical Exam Triage Vital Signs ED Triage Vitals  Enc Vitals Group     BP 09/03/18 1114 130/86     Pulse Rate 09/03/18 1114 96     Resp 09/03/18 1114 20     Temp 09/03/18 1114 99.9 F (37.7 C)     Temp src --      SpO2 09/03/18 1114 100 %     Weight --      Height --      Head Circumference --      Peak Flow --      Pain Score 09/03/18 1115 10     Pain Loc --      Pain Edu? --      Excl. in [place]? --    No data found.  Updated Vital Signs BP 130/86   Pulse 96   Temp 99.9 F (37.7 C)   Resp 20   SpO2 100%   Visual Acuity Right Eye Distance:   Left Eye Distance:   Bilateral Distance:    Right Eye Near:   Left Eye Near:    Bilateral Near:     Physical Exam  Constitutional: She is oriented to person, place, and time. She appears well-developed and well-nourished. No distress.  HENT:  Head: Normocephalic and atraumatic.  Right Ear: Tympanic membrane, external ear and ear canal normal. Tympanic membrane is not erythematous and not bulging.  Left Ear: Tympanic membrane, external ear and ear canal normal. Tympanic membrane is not erythematous and not bulging.  Nose: Nose normal. Right sinus exhibits no maxillary sinus tenderness and no frontal sinus tenderness. Left sinus exhibits no maxillary sinus tenderness and no frontal sinus tenderness.  Mouth/Throat: Uvula is midline, oropharynx is clear and moist and mucous membranes are normal.  Eyes: Pupils are equal, round, and reactive to light. Conjunctivae are normal.  Neck: Normal range of motion. Neck supple.  Cardiovascular: Normal rate, regular rhythm and normal heart sounds. Exam reveals no gallop and no friction rub.  No murmur heard. Pulmonary/Chest: Effort normal. No accessory muscle usage. No respiratory distress.  Right lower field rales.  Lymphadenopathy:    She has no cervical  adenopathy.  Neurological: She is alert and oriented to person, place, and time.  Skin: Skin is warm and dry.  Psychiatric: She has a normal  mood and affect. Her behavior is normal. Judgment normal.     UC Treatments / Results  Labs (all labs ordered are listed, but only abnormal results are displayed) Labs Reviewed - No data to display  EKG None  Radiology No results found.  Procedures Procedures (including critical care time)  Medications Ordered in UC Medications - No data to display  Initial Impression / Assessment and Plan / UC Course  I have reviewed the triage vital signs and the nursing notes.  Pertinent labs & imaging results that were available during my care of the patient were reviewed by me and considered in my medical decision making (see chart for details).    Patient is fatigued in appearance, but nontoxic. She is afebrile without hypoxia, tachycardia. Will cover for pneumonia given right lower field rails, start azithromycin as directed.  Other symptomatic treatment discussed.  Return precautions given.  Patient expresses understanding and agrees to plan.  Final Clinical Impressions(s) / UC Diagnoses   Final diagnoses:  Lower respiratory infection    ED Prescriptions    Medication Sig Dispense Auth. Provider   azithromycin (ZITHROMAX) 250 MG tablet Take 1 tablet (250 mg total) by mouth daily. Take first 2 tablets together, then 1 every day until finished. 6 tablet Yu, Amy V, PA-C   benzonatate (TESSALON) 100 MG capsule Take 1 capsule (100 mg total) by mouth every 8 (eight) hours. 21 capsule Yu, Amy V, PA-C   ipratropium (ATROVENT) 0.06 % nasal spray Place 2 sprays into both nostrils 4 (four) times daily. 15 mL Yu, Amy V, PA-C   ibuprofen (ADVIL,MOTRIN) 800 MG tablet Take 1 tablet (800 mg total) by mouth 3 (three) times daily. 21 tablet Tobin Chad, PA-C 09/03/18 1152

## 2018-09-03 NOTE — ED Triage Notes (Signed)
Pt c/o coughing up green and yellow gunk since Monday, states her cough has gotten worse since last night, c/o body aches and hot/cold flashes.

## 2018-09-25 ENCOUNTER — Other Ambulatory Visit: Payer: Self-pay | Admitting: Obstetrics and Gynecology

## 2018-10-21 ENCOUNTER — Other Ambulatory Visit (HOSPITAL_COMMUNITY): Payer: Self-pay | Admitting: *Deleted

## 2018-10-21 DIAGNOSIS — Z1231 Encounter for screening mammogram for malignant neoplasm of breast: Secondary | ICD-10-CM

## 2018-11-25 ENCOUNTER — Encounter (HOSPITAL_COMMUNITY): Payer: Self-pay | Admitting: Emergency Medicine

## 2018-11-25 ENCOUNTER — Other Ambulatory Visit: Payer: Self-pay

## 2018-11-25 ENCOUNTER — Emergency Department (HOSPITAL_COMMUNITY)
Admission: EM | Admit: 2018-11-25 | Discharge: 2018-11-25 | Disposition: A | Discharge status: Home / Self Care | Payer: Self-pay | Attending: Emergency Medicine | Admitting: Emergency Medicine

## 2018-11-25 DIAGNOSIS — I10 Essential (primary) hypertension: Secondary | ICD-10-CM | POA: Insufficient documentation

## 2018-11-25 DIAGNOSIS — E876 Hypokalemia: Secondary | ICD-10-CM | POA: Insufficient documentation

## 2018-11-25 DIAGNOSIS — Z79899 Other long term (current) drug therapy: Secondary | ICD-10-CM | POA: Insufficient documentation

## 2018-11-25 DIAGNOSIS — F17210 Nicotine dependence, cigarettes, uncomplicated: Secondary | ICD-10-CM | POA: Insufficient documentation

## 2018-11-25 DIAGNOSIS — A084 Viral intestinal infection, unspecified: Secondary | ICD-10-CM | POA: Insufficient documentation

## 2018-11-25 LAB — COMPREHENSIVE METABOLIC PANEL
ALT: 18 U/L (ref 0–44)
AST: 25 U/L (ref 15–41)
Albumin: 3.9 g/dL (ref 3.5–5.0)
Alkaline Phosphatase: 39 U/L (ref 38–126)
Anion gap: 7 (ref 5–15)
BUN: 12 mg/dL (ref 6–20)
CO2: 27 mmol/L (ref 22–32)
Calcium: 9.2 mg/dL (ref 8.9–10.3)
Chloride: 107 mmol/L (ref 98–111)
Creatinine, Ser: 0.95 mg/dL (ref 0.44–1.00)
GFR calc Af Amer: >60 mL/min (ref >60)
GFR calc non Af Amer: >60 mL/min (ref >60)
Glucose, Bld: 62 mg/dL — ABNORMAL LOW (ref 70–99)
Potassium: 3.2 mmol/L — ABNORMAL LOW (ref 3.5–5.1)
Sodium: 141 mmol/L (ref 135–145)
Total Bilirubin: 0.9 mg/dL (ref 0.3–1.2)
Total Protein: 6.8 g/dL (ref 6.5–8.1)

## 2018-11-25 LAB — CBC
HCT: 37 % (ref 36.0–46.0)
Hemoglobin: 12 g/dL (ref 12.0–15.0)
MCH: 33.4 pg (ref 26.0–34.0)
MCHC: 32.4 g/dL (ref 30.0–36.0)
MCV: 103.1 fL — ABNORMAL HIGH (ref 80.0–100.0)
Platelets: 173 10*3/uL (ref 150–400)
RBC: 3.59 MIL/uL — ABNORMAL LOW (ref 3.87–5.11)
RDW: 11.9 % (ref 11.5–15.5)
WBC: 4.5 10*3/uL (ref 4.0–10.5)
nRBC: 0 % (ref 0.0–0.2)

## 2018-11-25 LAB — I-STAT BETA HCG BLOOD, ED (MC, WL, AP ONLY): I-stat hCG, quantitative: <5 m[IU]/mL (ref <5)

## 2018-11-25 LAB — LIPASE, BLOOD: Lipase: 22 U/L (ref 11–51)

## 2018-11-25 MED ORDER — ONDANSETRON HCL 4 MG PO TABS: 4.0000 mg | ORAL_TABLET | Freq: Four times a day (QID) | ORAL | 0 refills | Status: DC

## 2018-11-25 MED ORDER — ONDANSETRON HCL 4 MG PO TABS
4.0000 mg | ORAL_TABLET | Freq: Once | ORAL | Status: AC
  Administered 2018-11-25: 4 mg via ORAL
  Filled 2018-11-25: qty 1

## 2018-11-25 MED ORDER — SODIUM CHLORIDE 0.9% FLUSH: 3.0000 mL | Freq: Once | INTRAVENOUS | Status: DC

## 2018-11-25 NOTE — ED Notes (Signed)
Patient is aware she needs a urine states she cant urinate now.

## 2018-11-25 NOTE — ED Provider Notes (Signed)
[place] [facility] Provider Note   CSN: [id number] Arrival date & time: 11/25/18  1248   History   Chief Complaint Chief Complaint  Patient presents with  . Emesis    HPI Chelsea Washington is a 48 y.o. female.  HPI  48 year old female presents today with complaints of nausea and vomiting.  Patient notes her symptoms started approximately 4 days ago with nausea and vomiting.  She notes some minor abdominal cramping associated with the abdominal pain.  She denies any diarrhea, fever, pain at baseline.  She denies any abnormal exposures.  She denies any close sick contacts.  Other infectious etiology.  Past Medical History:  Diagnosis Date  . Hypertension   . UTI (lower urinary tract infection)     Patient Active Problem List   Diagnosis Date Noted  . Vitamin D insufficiency 03/07/2015  . Essential hypertension 03/04/2015  . Trichomonas vaginitis 06/08/2013    Past Surgical History:  Procedure Laterality Date  . NO PAST SURGERIES    . WISDOM TOOTH EXTRACTION       OB History    Gravida  1   Para  1   Term  1   Preterm      AB      Living  1     SAB      TAB      Ectopic      Multiple      Live Births  1            Home Medications    Prior to Admission medications   Medication Sig Start Date End Date Taking? Authorizing Provider  acetaminophen (TYLENOL) 500 MG tablet Take 500 mg by mouth every 6 (six) hours as needed (menstrual cramping).    [provider]  albuterol (PROVENTIL HFA;VENTOLIN HFA) 108 (90 BASE) MCG/ACT inhaler Inhale 2 puffs into the lungs every 6 (six) hours as needed for wheezing or shortness of breath.    [provider]  aspirin EC 81 MG tablet Take 81 mg by mouth daily.    [provider]  azithromycin (ZITHROMAX) 250 MG tablet Take 1 tablet (250 mg total) by mouth daily. Take first 2 tablets together, then 1 every day until finished. 09/03/18   Tasia Catchings, Amy V, PA-C    benzonatate (TESSALON) 100 MG capsule Take 1 capsule (100 mg total) by mouth every 8 (eight) hours. 09/03/18   Tasia Catchings, Amy V, PA-C  Cholecalciferol (VITAMIN D3) 2000 UNITS TABS Take 2,000 Units by mouth daily. Patient not taking: Reported on 07/20/2016 03/07/15   Boykin Nearing, MD  ibuprofen (ADVIL,MOTRIN) 800 MG tablet Take 1 tablet (800 mg total) by mouth 3 (three) times daily. 09/03/18   Yu, Amy V, PA-C  ipratropium (ATROVENT) 0.06 % nasal spray Place 2 sprays into both nostrils 4 (four) times daily. 09/03/18   Yu, Amy V, PA-C  IRON PO Take by mouth.    [provider]  lisinopril (PRINIVIL,ZESTRIL) 10 MG tablet Take 1 tablet (10 mg total) by mouth daily. 02/17/15 08/24/16  Montine Circle, PA-C  lisinopril (PRINIVIL,ZESTRIL) 10 MG tablet Take 10 mg by mouth daily.    [provider]  naproxen (NAPROSYN) 500 MG tablet Take 1 tablet (500 mg total) by mouth 2 (two) times daily. Patient not taking: Reported on 07/20/2016 02/21/15   Britt Bottom, NP  ondansetron (ZOFRAN ODT) 4 MG disintegrating tablet Take 1 tablet (4 mg total) by mouth every 8 (eight) hours as needed for  nausea or vomiting. Patient not taking: Reported on 09/03/2018 01/27/18   Ok Edwards, PA-C  ondansetron (ZOFRAN) 4 MG tablet Take 1 tablet (4 mg total) by mouth every 6 (six) hours. 11/25/18   Shervon Kerwin, Dellis Filbert, PA-C  promethazine (PHENERGAN) 25 MG tablet Take 1 tablet (25 mg total) by mouth every 6 (six) hours as needed for nausea. Patient not taking: Reported on 07/20/2016 06/08/13   Manya Silvas, CNM    Family History Family History  Problem Relation Age of Onset  . Diabetes Maternal Grandmother   . Stroke Maternal Grandmother   . Heart disease Maternal Grandfather   . Hypertension Mother   . Asthma Mother   . Asthma Sister   . Hypertension Maternal Aunt   . Cancer Maternal Aunt        breast cancer  . Hypertension Maternal Uncle     Social History Social History   Tobacco Use  . Smoking status:  Current Some Day Smoker  . Smokeless tobacco: Never Used  Substance Use Topics  . Alcohol use: Yes    Comment: occassionally  . Drug use: Yes    Frequency: 2.0 times per week    Types: Marijuana     Allergies   Influenza vaccines; Peach [prunus persica]; and Penicillins   Review of Systems Review of Systems  All other systems reviewed and are negative.    Physical Exam Updated Vital Signs BP 129/89 (BP Location: Right Arm)   Pulse 73   Temp 98 F (36.7 C) (Oral)   Resp 17   Ht 5\' 1"  (1.549 m)   Wt 59 kg   LMP 11/21/2018   SpO2 100%   BMI 24.56 kg/m   Physical Exam Vitals signs and nursing note reviewed.  Constitutional:      Appearance: She is well-developed.  HENT:     Head: Normocephalic and atraumatic.  Eyes:     General: No scleral icterus.       Right eye: No discharge.        Left eye: No discharge.     Conjunctiva/sclera: Conjunctivae normal.     Pupils: Pupils are equal, round, and reactive to light.  Neck:     Musculoskeletal: Normal range of motion.     Vascular: No JVD.     Trachea: No tracheal deviation.  Pulmonary:     Effort: Pulmonary effort is normal.     Breath sounds: No stridor.  Abdominal:     General: There is no distension.     Palpations: Abdomen is soft.     Tenderness: There is no abdominal tenderness.  Neurological:     Mental Status: She is alert and oriented to person, place, and time.     Coordination: Coordination normal.  Psychiatric:        Behavior: Behavior normal.        Thought Content: Thought content normal.        Judgment: Judgment normal.      ED Treatments / Results  Labs (all labs ordered are listed, but only abnormal results are displayed) Labs Reviewed  COMPREHENSIVE METABOLIC PANEL - Abnormal; Notable for the following components:      Result Value   Potassium 3.2 (*)    Glucose, Bld 62 (*)    All other components within normal limits  CBC - Abnormal; Notable for the following components:    RBC 3.59 (*)    MCV 103.1 (*)    All other components within normal limits  LIPASE,  BLOOD  URINALYSIS, ROUTINE W REFLEX MICROSCOPIC  I-STAT BETA HCG BLOOD, ED ([facility], [facility], [facility] ONLY)    EKG None  Radiology No results found.  Procedures Procedures (including critical care time)  Medications Ordered in ED Medications  sodium chloride flush (NS) 0.9 % injection 3 mL (3 mLs Intravenous Not Given 11/25/18 1349)  ondansetron (ZOFRAN) tablet 4 mg (4 mg Oral Given 11/25/18 1342)     Initial Impression / Assessment and Plan / ED Course  I have reviewed the triage vital signs and the nursing notes.  Pertinent labs & imaging results that were available during my care of the patient were reviewed by me and considered in my medical decision making (see chart for details).     48 year old female presents today with complaints of nausea and vomiting.  She is well-appearing in no acute distress.  She has a soft nontender abdomen, slight hypokalemia otherwise no significant abnormalities noted.  Patient given Zofran here tolerating p.o., likely viral gastroenteritis, no signs of acute bacterial infection.  Discharged with symptomatic care and strict return precautions.  She verbalized understanding and agreement to today's plan.  Final Clinical Impressions(s) / ED Diagnoses   Final diagnoses:  Hypokalemia  Viral gastroenteritis    ED Discharge Orders         Ordered    ondansetron (ZOFRAN) 4 MG tablet  Every 6 hours     11/25/18 1429           Okey Regal, PA-C 11/25/18 1430    Quintella Reichert, MD 11/25/18 [phone]

## 2018-11-25 NOTE — ED Triage Notes (Signed)
Pt reports emesis since Sunday evening. Denies abdominal pain except when vomiting. No fever. Some cough. VSS.

## 2018-11-25 NOTE — Discharge Instructions (Addendum)
Please read the attached information.  Please increase diet with potassium rich foods including bananas.  Please return immediately if develop any new or worsening signs or symptoms

## 2018-11-27 ENCOUNTER — Emergency Department (HOSPITAL_COMMUNITY)
Admission: EM | Admit: 2018-11-27 | Discharge: 2018-11-27 | Disposition: A | Discharge status: Home / Self Care | Payer: Self-pay | Attending: Emergency Medicine | Admitting: Emergency Medicine

## 2018-11-27 ENCOUNTER — Emergency Department (HOSPITAL_COMMUNITY): Payer: Self-pay

## 2018-11-27 DIAGNOSIS — Y9301 Activity, walking, marching and hiking: Secondary | ICD-10-CM | POA: Insufficient documentation

## 2018-11-27 DIAGNOSIS — W01198A Fall on same level from slipping, tripping and stumbling with subsequent striking against other object, initial encounter: Secondary | ICD-10-CM | POA: Insufficient documentation

## 2018-11-27 DIAGNOSIS — Z79899 Other long term (current) drug therapy: Secondary | ICD-10-CM | POA: Insufficient documentation

## 2018-11-27 DIAGNOSIS — I10 Essential (primary) hypertension: Secondary | ICD-10-CM | POA: Insufficient documentation

## 2018-11-27 DIAGNOSIS — Y998 Other external cause status: Secondary | ICD-10-CM | POA: Insufficient documentation

## 2018-11-27 DIAGNOSIS — S46911A Strain of unspecified muscle, fascia and tendon at shoulder and upper arm level, right arm, initial encounter: Secondary | ICD-10-CM | POA: Insufficient documentation

## 2018-11-27 DIAGNOSIS — F17200 Nicotine dependence, unspecified, uncomplicated: Secondary | ICD-10-CM | POA: Insufficient documentation

## 2018-11-27 DIAGNOSIS — F1210 Cannabis abuse, uncomplicated: Secondary | ICD-10-CM | POA: Insufficient documentation

## 2018-11-27 DIAGNOSIS — Y92019 Unspecified place in single-family (private) house as the place of occurrence of the external cause: Secondary | ICD-10-CM | POA: Insufficient documentation

## 2018-11-27 MED ORDER — MELOXICAM 7.5 MG PO TABS: 7.5000 mg | ORAL_TABLET | Freq: Every day | ORAL | 0 refills | Status: AC

## 2018-11-27 NOTE — ED Notes (Signed)
Pt verbalized understanding of d/c instructions and has no further questions, VSS, NAD.  

## 2018-11-27 NOTE — Discharge Instructions (Signed)
Alternate warm/cold compresses to right shoulder. Take Meloxicam daily as prescribed as needed for pain. Recheck with your doctor in 1 week. Gentle range of motion exercises.

## 2018-11-27 NOTE — ED Triage Notes (Signed)
Pt endorses witnessed fall last night where she slipped and fell onto her right arm/shoulder. Pt doesn't think she passed out. Complains of right shoulder pain now. VSS

## 2018-11-27 NOTE — ED Provider Notes (Signed)
[facility] EMERGENCY DEPARTMENT Provider Note   CSN: [id number] Arrival date & time: 11/27/18  1051     History   Chief Complaint Chief Complaint  Patient presents with  . Fall    HPI Chelsea Washington is a 48 y.o. female.  48 year old female presents with complaint of right shoulder pain.  Patient states that she got up in the middle the night to 10 to her grandson and tripped and fell landing on her right shoulder.  Patient reports pain with movement of her right shoulder, pain radiates down towards her fingers.  No history of prior shoulder injuries, denies any other injuries, complaints or concerns.     Past Medical History:  Diagnosis Date  . Hypertension   . UTI (lower urinary tract infection)     Patient Active Problem List   Diagnosis Date Noted  . Vitamin D insufficiency 03/07/2015  . Essential hypertension 03/04/2015  . Trichomonas vaginitis 06/08/2013    Past Surgical History:  Procedure Laterality Date  . NO PAST SURGERIES    . WISDOM TOOTH EXTRACTION       OB History    Gravida  1   Para  1   Term  1   Preterm      AB      Living  1     SAB      TAB      Ectopic      Multiple      Live Births  1            Home Medications    Prior to Admission medications   Medication Sig Start Date End Date Taking? Authorizing Provider  acetaminophen (TYLENOL) 500 MG tablet Take 500 mg by mouth every 6 (six) hours as needed (menstrual cramping).    [provider]  albuterol (PROVENTIL HFA;VENTOLIN HFA) 108 (90 BASE) MCG/ACT inhaler Inhale 2 puffs into the lungs every 6 (six) hours as needed for wheezing or shortness of breath.    [provider]  aspirin EC 81 MG tablet Take 81 mg by mouth daily.    [provider]  benzonatate (TESSALON) 100 MG capsule Take 1 capsule (100 mg total) by mouth every 8 (eight) hours. 09/03/18   Tasia Catchings, Amy V, PA-C  Cholecalciferol (VITAMIN D3) 2000 UNITS TABS Take  2,000 Units by mouth daily. Patient not taking: Reported on 07/20/2016 03/07/15   Boykin Nearing, MD  ipratropium (ATROVENT) 0.06 % nasal spray Place 2 sprays into both nostrils 4 (four) times daily. 09/03/18   Yu, Amy V, PA-C  IRON PO Take by mouth.    [provider]  lisinopril (PRINIVIL,ZESTRIL) 10 MG tablet Take 1 tablet (10 mg total) by mouth daily. 02/17/15 08/24/16  Montine Circle, PA-C  lisinopril (PRINIVIL,ZESTRIL) 10 MG tablet Take 10 mg by mouth daily.    [provider]  meloxicam (MOBIC) 7.5 MG tablet Take 1 tablet (7.5 mg total) by mouth daily for 10 days. 11/27/18 12/07/18  Tacy Learn, PA-C  ondansetron (ZOFRAN) 4 MG tablet Take 1 tablet (4 mg total) by mouth every 6 (six) hours. 11/25/18   Okey Regal, PA-C    Family History Family History  Problem Relation Age of Onset  . Diabetes Maternal Grandmother   . Stroke Maternal Grandmother   . Heart disease Maternal Grandfather   . Hypertension Mother   . Asthma Mother   . Asthma Sister   . Hypertension Maternal Aunt   . Cancer Maternal  Aunt        breast cancer  . Hypertension Maternal Uncle     Social History Social History   Tobacco Use  . Smoking status: Current Some Day Smoker  . Smokeless tobacco: Never Used  Substance Use Topics  . Alcohol use: Yes    Comment: occassionally  . Drug use: Yes    Frequency: 2.0 times per week    Types: Marijuana     Allergies   Influenza vaccines; Peach [prunus persica]; and Penicillins   Review of Systems Review of Systems  Constitutional: Negative for fever.  Eyes: Negative for visual disturbance.  Gastrointestinal: Negative for nausea and vomiting.  Musculoskeletal: Positive for arthralgias and myalgias. Negative for neck pain and neck stiffness.  Skin: Negative for color change, rash and wound.  Allergic/Immunologic: Negative for immunocompromised state.  Neurological: Negative for weakness and numbness.  Hematological: Does not  bruise/bleed easily.  Psychiatric/Behavioral: Negative for confusion.  All other systems reviewed and are negative.    Physical Exam Updated Vital Signs BP 103/68 (BP Location: Left Arm)   Pulse 73   Temp 98 F (36.7 C) (Oral)   Resp 16   LMP 11/21/2018   SpO2 100%   Physical Exam Vitals signs and nursing note reviewed.  Constitutional:      General: She is not in acute distress.    Appearance: She is well-developed. She is not diaphoretic.  HENT:     Head: Normocephalic and atraumatic.  Cardiovascular:     Pulses: Normal pulses.  Pulmonary:     Effort: Pulmonary effort is normal.  Musculoskeletal:        General: Tenderness present. No swelling or deformity.     Right shoulder: She exhibits decreased range of motion and tenderness. She exhibits no bony tenderness, no swelling, no effusion, no crepitus, no deformity, no laceration and normal pulse.     Right elbow: Normal.    Cervical back: She exhibits normal range of motion, no tenderness and no bony tenderness.       Arms:  Skin:    General: Skin is warm and dry.     Findings: No erythema or rash.  Neurological:     Mental Status: She is alert and oriented to person, place, and time.  Psychiatric:        Behavior: Behavior normal.      ED Treatments / Results  Labs (all labs ordered are listed, but only abnormal results are displayed) Labs Reviewed - No data to display  EKG None  Radiology Dg Shoulder Right  Result Date: 11/27/2018 CLINICAL DATA:  [place] from bed today. Superior right shoulder pain. Initial encounter. EXAM: RIGHT SHOULDER - 2+ VIEW COMPARISON:  None. FINDINGS: There is no evidence of fracture or dislocation. There is no evidence of arthropathy or other focal bone abnormality. Soft tissues are unremarkable. IMPRESSION: Negative. Electronically Signed   By: Logan Bores M.D.   On: 11/27/2018 11:35    Procedures Procedures (including critical care time)  Medications Ordered in  ED Medications - No data to display   Initial Impression / Assessment and Plan / ED Course  I have reviewed the triage vital signs and the nursing notes.  Pertinent labs & imaging results that were available during my care of the patient were reviewed by me and considered in my medical decision making (see chart for details).  Clinical Course as of Nov 27 1148  Thu Jan 23, 575  [id number] 48 year old presents with complaint of right shoulder  pain after mechanical fall.  X-rays negative for fracture.  Recommend warm compresses to joint, gentle exercises as tolerated, meloxicam as needed as prescribed.   [LM]    Clinical Course User Index [LM] Tacy Learn, PA-C   Final Clinical Impressions(s) / ED Diagnoses   Final diagnoses:  Strain of right shoulder, initial encounter    ED Discharge Orders         Ordered    meloxicam (MOBIC) 7.5 MG tablet  Daily     11/27/18 1148           Roque Lias 11/27/18 1150    Isla Pence, MD 11/27/18 1213

## 2018-12-02 ENCOUNTER — Emergency Department (HOSPITAL_COMMUNITY)
Admission: EM | Admit: 2018-12-02 | Discharge: 2018-12-03 | Disposition: A | Discharge status: Home / Self Care | Payer: Self-pay | Attending: Emergency Medicine | Admitting: Emergency Medicine

## 2018-12-02 DIAGNOSIS — R69 Illness, unspecified: Secondary | ICD-10-CM

## 2018-12-02 DIAGNOSIS — Z7982 Long term (current) use of aspirin: Secondary | ICD-10-CM | POA: Insufficient documentation

## 2018-12-02 DIAGNOSIS — I10 Essential (primary) hypertension: Secondary | ICD-10-CM | POA: Insufficient documentation

## 2018-12-02 DIAGNOSIS — F17200 Nicotine dependence, unspecified, uncomplicated: Secondary | ICD-10-CM | POA: Insufficient documentation

## 2018-12-02 DIAGNOSIS — J111 Influenza due to unidentified influenza virus with other respiratory manifestations: Secondary | ICD-10-CM | POA: Insufficient documentation

## 2018-12-02 DIAGNOSIS — Z79899 Other long term (current) drug therapy: Secondary | ICD-10-CM | POA: Insufficient documentation

## 2018-12-02 LAB — COMPREHENSIVE METABOLIC PANEL
ALT: 13 U/L (ref 0–44)
AST: 22 U/L (ref 15–41)
Albumin: 3.8 g/dL (ref 3.5–5.0)
Alkaline Phosphatase: 50 U/L (ref 38–126)
Anion gap: 8 (ref 5–15)
BILIRUBIN TOTAL: 0.6 mg/dL (ref 0.3–1.2)
BUN: 14 mg/dL (ref 6–20)
CO2: 25 mmol/L (ref 22–32)
Calcium: 9.2 mg/dL (ref 8.9–10.3)
Chloride: 106 mmol/L (ref 98–111)
Creatinine, Ser: 0.95 mg/dL (ref 0.44–1.00)
GFR calc Af Amer: >60 mL/min (ref >60)
GFR calc non Af Amer: >60 mL/min (ref >60)
Glucose, Bld: 134 mg/dL — ABNORMAL HIGH (ref 70–99)
Potassium: 3.4 mmol/L — ABNORMAL LOW (ref 3.5–5.1)
Sodium: 139 mmol/L (ref 135–145)
Total Protein: 6.7 g/dL (ref 6.5–8.1)

## 2018-12-02 LAB — CBC WITH DIFFERENTIAL/PLATELET
Abs Immature Granulocytes: 0 10*3/uL (ref 0.00–0.07)
Basophils Absolute: 0 10*3/uL (ref 0.0–0.1)
Basophils Relative: 0 %
Eosinophils Absolute: 0 10*3/uL (ref 0.0–0.5)
Eosinophils Relative: 0 %
HCT: 35.9 % — ABNORMAL LOW (ref 36.0–46.0)
Hemoglobin: 12 g/dL (ref 12.0–15.0)
Lymphocytes Relative: 17 %
Lymphs Abs: 1.3 10*3/uL (ref 0.7–4.0)
MCH: 33.9 pg (ref 26.0–34.0)
MCHC: 33.4 g/dL (ref 30.0–36.0)
MCV: 101.4 fL — ABNORMAL HIGH (ref 80.0–100.0)
MONOS PCT: 6 %
Monocytes Absolute: 0.5 10*3/uL (ref 0.1–1.0)
NRBC: 0 /100{WBCs}
Neutro Abs: 5.9 10*3/uL (ref 1.7–7.7)
Neutrophils Relative %: 77 %
Platelets: 171 10*3/uL (ref 150–400)
RBC: 3.54 MIL/uL — ABNORMAL LOW (ref 3.87–5.11)
RDW: 11.5 % (ref 11.5–15.5)
WBC: 7.6 10*3/uL (ref 4.0–10.5)
nRBC: 0 % (ref 0.0–0.2)

## 2018-12-02 LAB — I-STAT BETA HCG BLOOD, ED (MC, WL, AP ONLY)

## 2018-12-02 LAB — LIPASE, BLOOD: Lipase: 24 U/L (ref 11–51)

## 2018-12-02 MED ORDER — OSELTAMIVIR PHOSPHATE 75 MG PO CAPS
75.0000 mg | ORAL_CAPSULE | Freq: Once | ORAL | Status: AC
  Administered 2018-12-02: 75 mg via ORAL
  Filled 2018-12-02: qty 1

## 2018-12-02 MED ORDER — IBUPROFEN 800 MG PO TABS
800.0000 mg | ORAL_TABLET | Freq: Once | ORAL | Status: AC
  Administered 2018-12-02: 800 mg via ORAL
  Filled 2018-12-02: qty 1

## 2018-12-02 MED ORDER — OSELTAMIVIR PHOSPHATE 75 MG PO CAPS: 75.0000 mg | ORAL_CAPSULE | Freq: Two times a day (BID) | ORAL | 0 refills | Status: DC

## 2018-12-02 MED ORDER — SODIUM CHLORIDE 0.9 % IV BOLUS
1000.0000 mL | Freq: Once | INTRAVENOUS | Status: AC
  Administered 2018-12-02: 1000 mL via INTRAVENOUS

## 2018-12-02 NOTE — ED Triage Notes (Signed)
Pt here with c/o n/v and had a syncopal episode while at work , hitting her head ,

## 2018-12-03 NOTE — ED Provider Notes (Signed)
[facility] EMERGENCY DEPARTMENT Provider Note   CSN: [id number] Arrival date & time: 12/02/18  1245     History   Chief Complaint Chief Complaint  Patient presents with  . Nausea  . Emesis    HPI Chelsea Washington is a 48 y.o. female.  The history is provided by the patient.  Emesis  Severity:  Moderate Duration:  2 days Timing:  Constant Quality:  Stomach contents Progression:  Unchanged Chronicity:  New Relieved by:  Nothing Worsened by:  Nothing Ineffective treatments:  None tried Associated symptoms: cough, fever and sore throat     Past Medical History:  Diagnosis Date  . Hypertension   . UTI (lower urinary tract infection)     Patient Active Problem List   Diagnosis Date Noted  . Vitamin D insufficiency 03/07/2015  . Essential hypertension 03/04/2015  . Trichomonas vaginitis 06/08/2013    Past Surgical History:  Procedure Laterality Date  . NO PAST SURGERIES    . WISDOM TOOTH EXTRACTION       OB History    Gravida  1   Para  1   Term  1   Preterm      AB      Living  1     SAB      TAB      Ectopic      Multiple      Live Births  1            Home Medications    Prior to Admission medications   Medication Sig Start Date End Date Taking? Authorizing Provider  acetaminophen (TYLENOL) 500 MG tablet Take 500 mg by mouth every 6 (six) hours as needed (menstrual cramping).    [provider]  albuterol (PROVENTIL HFA;VENTOLIN HFA) 108 (90 BASE) MCG/ACT inhaler Inhale 2 puffs into the lungs every 6 (six) hours as needed for wheezing or shortness of breath.    [provider]  aspirin EC 81 MG tablet Take 81 mg by mouth daily.    [provider]  benzonatate (TESSALON) 100 MG capsule Take 1 capsule (100 mg total) by mouth every 8 (eight) hours. 09/03/18   Tasia Catchings, Amy V, PA-C  Cholecalciferol (VITAMIN D3) 2000 UNITS TABS Take 2,000 Units by mouth daily. Patient not taking: Reported on  07/20/2016 03/07/15   Boykin Nearing, MD  ipratropium (ATROVENT) 0.06 % nasal spray Place 2 sprays into both nostrils 4 (four) times daily. 09/03/18   Yu, Amy V, PA-C  IRON PO Take by mouth.    [provider]  lisinopril (PRINIVIL,ZESTRIL) 10 MG tablet Take 1 tablet (10 mg total) by mouth daily. 02/17/15 08/24/16  Montine Circle, PA-C  lisinopril (PRINIVIL,ZESTRIL) 10 MG tablet Take 10 mg by mouth daily.    [provider]  meloxicam (MOBIC) 7.5 MG tablet Take 1 tablet (7.5 mg total) by mouth daily for 10 days. 11/27/18 12/07/18  Tacy Learn, PA-C  ondansetron (ZOFRAN) 4 MG tablet Take 1 tablet (4 mg total) by mouth every 6 (six) hours. 11/25/18   Hedges, Dellis Filbert, PA-C  oseltamivir (TAMIFLU) 75 MG capsule Take 1 capsule (75 mg total) by mouth every 12 (twelve) hours. 12/02/18   Yesenia Locurto, Corene Cornea, MD    Family History Family History  Problem Relation Age of Onset  . Diabetes Maternal Grandmother   . Stroke Maternal Grandmother   . Heart disease Maternal Grandfather   . Hypertension Mother   . Asthma Mother   . Asthma  Sister   . Hypertension Maternal Aunt   . Cancer Maternal Aunt        breast cancer  . Hypertension Maternal Uncle     Social History Social History   Tobacco Use  . Smoking status: Current Some Day Smoker  . Smokeless tobacco: Never Used  Substance Use Topics  . Alcohol use: Yes    Comment: occassionally  . Drug use: Yes    Frequency: 2.0 times per week    Types: Marijuana     Allergies   Influenza vaccines; Peach [prunus persica]; and Penicillins   Review of Systems Review of Systems  Constitutional: Positive for fever.  HENT: Positive for sore throat.   Respiratory: Positive for cough.   Gastrointestinal: Positive for vomiting.  All other systems reviewed and are negative.    Physical Exam Updated Vital Signs BP 128/86   Pulse (!) 104   Temp 98.3 F (36.8 C)   Resp (!) 21   LMP 11/21/2018   SpO2 99%   Physical Exam Vitals  signs and nursing note reviewed.  Constitutional:      Appearance: She is well-developed.  HENT:     Head: Normocephalic and atraumatic.     Mouth/Throat:     Mouth: Mucous membranes are dry.     Pharynx: Oropharynx is clear.  Eyes:     Extraocular Movements: Extraocular movements intact.     Conjunctiva/sclera: Conjunctivae normal.  Neck:     Musculoskeletal: Normal range of motion.  Cardiovascular:     Rate and Rhythm: Regular rhythm. Tachycardia present.  Pulmonary:     Effort: Pulmonary effort is normal. No respiratory distress.     Breath sounds: No stridor.  Abdominal:     General: Abdomen is flat. There is no distension.  Skin:    General: Skin is warm and dry.  Neurological:     General: No focal deficit present.     Mental Status: She is alert.      ED Treatments / Results  Labs (all labs ordered are listed, but only abnormal results are displayed) Labs Reviewed  COMPREHENSIVE METABOLIC PANEL - Abnormal; Notable for the following components:      Result Value   Potassium 3.4 (*)    Glucose, Bld 134 (*)    All other components within normal limits  CBC WITH DIFFERENTIAL/PLATELET - Abnormal; Notable for the following components:   RBC 3.54 (*)    HCT 35.9 (*)    MCV 101.4 (*)    All other components within normal limits  LIPASE, BLOOD  I-STAT BETA HCG BLOOD, ED ([facility], [facility], [facility] ONLY)    EKG EKG Interpretation  Date/Time:  Tuesday December 02 2018 21:05:01 EST Ventricular Rate:  101 PR Interval:    QRS Duration: 79 QT Interval:  351 QTC Calculation: 455 R Axis:   48 Text Interpretation:  Sinus tachycardia Low voltage, precordial leads No acute changes Confirmed by Merrily Pew [phone]) on 12/02/2018 10:11:01 PM   Radiology No results found.  Procedures Procedures (including critical care time)  Medications Ordered in ED Medications  sodium chloride 0.9 % bolus 1,000 mL (0 mLs Intravenous Stopped 12/02/18 2349)  oseltamivir (TAMIFLU) capsule 75 mg (75  mg Oral Given 12/02/18 2354)  ibuprofen (ADVIL,MOTRIN) tablet 800 mg (800 mg Oral Given 12/02/18 2354)     Initial Impression / Assessment and Plan / ED Course  I have reviewed the triage vital signs and the nursing notes.  Pertinent labs & imaging results that were  available during my care of the patient were reviewed by me and considered in my medical decision making (see chart for details).     Suspect likely influenza.  Will treat for same.  Also with some vomiting and symptoms improved with treatment here.  Already n.p.o.  Patient appears well with no respiratory distress.  Plan for discharge with PCP follow-up.  Final Clinical Impressions(s) / ED Diagnoses   Final diagnoses:  Influenza-like illness    ED Discharge Orders         Ordered    oseltamivir (TAMIFLU) 75 MG capsule  Every 12 hours     12/02/18 2343           Artur Winningham, Corene Cornea, MD 12/03/18 [phone]

## 2019-01-15 ENCOUNTER — Ambulatory Visit (HOSPITAL_COMMUNITY): Payer: Self-pay

## 2019-01-18 ENCOUNTER — Encounter (HOSPITAL_COMMUNITY): Payer: Self-pay | Admitting: Emergency Medicine

## 2019-01-18 ENCOUNTER — Ambulatory Visit (HOSPITAL_COMMUNITY)
Admission: EM | Admit: 2019-01-18 | Discharge: 2019-01-18 | Disposition: A | Discharge status: Home / Self Care | Payer: Self-pay | Attending: Family Medicine | Admitting: Family Medicine

## 2019-01-18 ENCOUNTER — Other Ambulatory Visit: Payer: Self-pay

## 2019-01-18 DIAGNOSIS — J301 Allergic rhinitis due to pollen: Secondary | ICD-10-CM

## 2019-01-18 MED ORDER — PREDNISONE 20 MG PO TABS: 20.0000 mg | ORAL_TABLET | Freq: Two times a day (BID) | ORAL | 0 refills | Status: AC

## 2019-01-18 NOTE — ED Triage Notes (Signed)
Pt presents to [facility] for assessemnt of cough, nasal congestion and hoarseness since Wednesday.  States she was exposed to fresh cut grass on Tuesday which has set off her allergies in the past before.

## 2019-01-18 NOTE — Discharge Instructions (Signed)
Take prednisone 2 times a day for 5 days This will reduce allergy symptoms Take 2 doses today May continue with Robitussin for cough Expect improvement in a couple of days Start back on your allergy medicine as soon as you are able

## 2019-01-18 NOTE — ED Provider Notes (Signed)
[facility]    CSN: [id number] Arrival date & time: 01/18/19  1149     History   Chief Complaint Chief Complaint  Patient presents with  . Cough  . Hoarse    HPI Chelsea Washington is a 48 y.o. female.   HPI  Patient states that they were cutting the grass around her home on Wednesday.  She went out in the fresh cut grass.  This does trigger her allergies.  She woke up Thursday morning with a stuffy nose, postnasal drip, cough.  Clear mucus.  Hoarseness.  She has no fever chills.  She is not coughing up any sputum.  She does not feel sick.  She is out of her antihistamines.  She states "they are coming in the mail".  She needs a note to go back to work next week.  Past Medical History:  Diagnosis Date  . Hypertension   . UTI (lower urinary tract infection)     Patient Active Problem List   Diagnosis Date Noted  . Vitamin D insufficiency 03/07/2015  . Essential hypertension 03/04/2015  . Trichomonas vaginitis 06/08/2013    Past Surgical History:  Procedure Laterality Date  . NO PAST SURGERIES    . WISDOM TOOTH EXTRACTION      OB History    Gravida  1   Para  1   Term  1   Preterm      AB      Living  1     SAB      TAB      Ectopic      Multiple      Live Births  1            Home Medications    Prior to Admission medications   Medication Sig Start Date End Date Taking? Authorizing Provider  IRON PO Take by mouth.   Yes [provider]  lisinopril (PRINIVIL,ZESTRIL) 10 MG tablet Take 10 mg by mouth daily.   Yes [provider]  loratadine (CLARITIN) 10 MG tablet Take 10 mg by mouth daily.   Yes [provider]  traZODone (DESYREL) 50 MG tablet Take 50 mg by mouth at bedtime.   Yes [provider]  acetaminophen (TYLENOL) 500 MG tablet Take 500 mg by mouth every 6 (six) hours as needed (menstrual cramping).    [provider]  aspirin EC 81 MG tablet Take 81 mg by mouth daily.     [provider]  ipratropium (ATROVENT) 0.06 % nasal spray Place 2 sprays into both nostrils 4 (four) times daily. 09/03/18   Tasia Catchings, Amy V, PA-C  lisinopril (PRINIVIL,ZESTRIL) 10 MG tablet Take 1 tablet (10 mg total) by mouth daily. 02/17/15 08/24/16  Montine Circle, PA-C  predniSONE (DELTASONE) 20 MG tablet Take 1 tablet (20 mg total) by mouth 2 (two) times daily with a meal. 01/18/19   Raylene Everts, MD    Family History Family History  Problem Relation Age of Onset  . Diabetes Maternal Grandmother   . Stroke Maternal Grandmother   . Heart disease Maternal Grandfather   . Hypertension Mother   . Asthma Mother   . Asthma Sister   . Hypertension Maternal Aunt   . Cancer Maternal Aunt        breast cancer  . Hypertension Maternal Uncle     Social History Social History   Tobacco Use  . Smoking status: Current Some Day Smoker    Packs/day: 0.10  .  Smokeless tobacco: Never Used  Substance Use Topics  . Alcohol use: Yes    Comment: occassionally  . Drug use: Yes    Frequency: 2.0 times per week    Types: Marijuana     Allergies   Influenza vaccines; Peach [prunus persica]; and Penicillins   Review of Systems Review of Systems  Constitutional: Negative for chills and fever.  HENT: Positive for congestion, postnasal drip, rhinorrhea, sneezing and voice change. Negative for ear pain and sore throat.   Eyes: Negative for pain and visual disturbance.  Respiratory: Positive for cough, shortness of breath and wheezing.   Cardiovascular: Negative for chest pain and palpitations.  Gastrointestinal: Negative for abdominal pain and vomiting.  Genitourinary: Negative for dysuria and hematuria.  Musculoskeletal: Negative for arthralgias and back pain.  Skin: Negative for color change and rash.  Neurological: Negative for seizures and syncope.  All other systems reviewed and are negative.    Physical Exam Triage Vital Signs ED Triage Vitals  Enc Vitals Group      BP 01/18/19 1216 126/87     Pulse Rate 01/18/19 1216 78     Resp 01/18/19 1216 18     Temp 01/18/19 1216 97.6 F (36.4 C)     Temp Source 01/18/19 1216 Temporal     SpO2 01/18/19 1216 100 %   No data found.  Updated Vital Signs BP 126/87 (BP Location: Right Arm)   Pulse 78   Temp 97.6 F (36.4 C) (Temporal)   Resp 18   LMP 12/10/2018 (Approximate)   SpO2 100%      Physical Exam Constitutional:      General: She is not in acute distress.    Appearance: She is well-developed and normal weight.     Comments: Hoarse voice  HENT:     Head: Normocephalic and atraumatic.     Right Ear: Tympanic membrane, ear canal and external ear normal.     Left Ear: Tympanic membrane, ear canal and external ear normal.     Nose:     Comments: Clear rhinorrhea    Mouth/Throat:     Mouth: Mucous membranes are moist.     Pharynx: No oropharyngeal exudate.     Comments: Appears normal Eyes:     Conjunctiva/sclera: Conjunctivae normal.     Pupils: Pupils are equal, round, and reactive to light.  Neck:     Musculoskeletal: Normal range of motion.  Cardiovascular:     Rate and Rhythm: Normal rate and regular rhythm.     Heart sounds: Normal heart sounds.  Pulmonary:     Effort: Pulmonary effort is normal. No respiratory distress.     Breath sounds: Wheezing present.     Comments: Few scattered wheeze Abdominal:     General: There is no distension.     Palpations: Abdomen is soft.  Musculoskeletal: Normal range of motion.  Lymphadenopathy:     Cervical: Cervical adenopathy present.  Skin:    General: Skin is warm and dry.  Neurological:     Mental Status: She is alert.      UC Treatments / Results  Labs (all labs ordered are listed, but only abnormal results are displayed) Labs Reviewed - No data to display  EKG None  Radiology No results found.  Procedures Procedures (including critical care time)  Medications Ordered in UC Medications - No data to display  Initial  Impression / Assessment and Plan / UC Course  I have reviewed the triage vital signs and the  nursing notes.  Pertinent labs & imaging results that were available during my care of the patient were reviewed by me and considered in my medical decision making (see chart for details).     Believe this is an allergic reaction.  Will treat with prednisone.  Expect prompt resolution of symptoms on prednisone Final Clinical Impressions(s) / UC Diagnoses   Final diagnoses:  Seasonal allergic rhinitis due to pollen     Discharge Instructions     Take prednisone 2 times a day for 5 days This will reduce allergy symptoms Take 2 doses today May continue with Robitussin for cough Expect improvement in a couple of days Start back on your allergy medicine as soon as you are able   ED Prescriptions    Medication Sig Dispense Auth. Provider   predniSONE (DELTASONE) 20 MG tablet Take 1 tablet (20 mg total) by mouth 2 (two) times daily with a meal. 10 tablet Raylene Everts, MD     Controlled Substance Prescriptions [place] Controlled Substance Registry consulted? Not Applicable   Raylene Everts, MD 01/18/19 [phone]

## 2019-01-19 ENCOUNTER — Encounter (HOSPITAL_COMMUNITY): Payer: Self-pay

## 2019-01-19 ENCOUNTER — Emergency Department (HOSPITAL_COMMUNITY): Payer: Self-pay

## 2019-01-19 ENCOUNTER — Other Ambulatory Visit: Payer: Self-pay

## 2019-01-19 ENCOUNTER — Emergency Department (HOSPITAL_COMMUNITY)
Admission: EM | Admit: 2019-01-19 | Discharge: 2019-01-19 | Disposition: A | Discharge status: Home / Self Care | Payer: Self-pay | Attending: Emergency Medicine | Admitting: Emergency Medicine

## 2019-01-19 DIAGNOSIS — R042 Hemoptysis: Secondary | ICD-10-CM | POA: Insufficient documentation

## 2019-01-19 DIAGNOSIS — F17210 Nicotine dependence, cigarettes, uncomplicated: Secondary | ICD-10-CM | POA: Insufficient documentation

## 2019-01-19 DIAGNOSIS — R05 Cough: Secondary | ICD-10-CM

## 2019-01-19 DIAGNOSIS — Z79899 Other long term (current) drug therapy: Secondary | ICD-10-CM | POA: Insufficient documentation

## 2019-01-19 DIAGNOSIS — J040 Acute laryngitis: Secondary | ICD-10-CM | POA: Insufficient documentation

## 2019-01-19 DIAGNOSIS — I10 Essential (primary) hypertension: Secondary | ICD-10-CM | POA: Insufficient documentation

## 2019-01-19 DIAGNOSIS — R059 Cough, unspecified: Secondary | ICD-10-CM

## 2019-01-19 DIAGNOSIS — Z7982 Long term (current) use of aspirin: Secondary | ICD-10-CM | POA: Insufficient documentation

## 2019-01-19 LAB — POC URINE PREG, ED: Preg Test, Ur: NEGATIVE

## 2019-01-19 MED ORDER — DEXAMETHASONE SODIUM PHOSPHATE 10 MG/ML IJ SOLN
10.0000 mg | Freq: Once | INTRAMUSCULAR | Status: AC
  Administered 2019-01-19: 10 mg via INTRAMUSCULAR
  Filled 2019-01-19: qty 1

## 2019-01-19 MED ORDER — CETIRIZINE HCL 10 MG PO TABS: 10.0000 mg | ORAL_TABLET | Freq: Every day | ORAL | 0 refills | Status: DC

## 2019-01-19 MED ORDER — ALBUTEROL SULFATE HFA 108 (90 BASE) MCG/ACT IN AERS
1.0000 | INHALATION_SPRAY | Freq: Once | RESPIRATORY_TRACT | Status: AC
  Administered 2019-01-19: 1 via RESPIRATORY_TRACT
  Filled 2019-01-19: qty 6.7

## 2019-01-19 NOTE — ED Notes (Signed)
Pt given food and beverage per Yetta Flock, RN

## 2019-01-19 NOTE — ED Notes (Signed)
Patient verbalizes understanding of discharge instructions. Opportunity for questioning and answers were provided. Pt discharged from ED. 

## 2019-01-19 NOTE — ED Provider Notes (Signed)
[facility] DEPART[facility] Provider Note   CSN: [id number] Arrival date & time: 01/19/19  1122    History   Chief Complaint Chief Complaint  Patient presents with  . Hematemesis    HPI Chelsea Washington is a 48 y.o. female.     HPI   Patient is a 48 year old female with a history of allergic rhinitis and hypertension presenting for persistent cough, hoarse voice and an episode of scant hemoptysis last night.  Patient reports that she was evaluated by urgent care yesterday and diagnosed with laryngitis and allergies.  She reports postnasal drip, congestion, rhinorrhea, and cough.  She reports hoarseness.  She denies any sore throat.  She reports that she was prescribed prednisone but she was unable to afford it.  She was instructed to go to the emergency department if she had any coughing up of blood.  Patient reports that she had an episode of scant vomitus is less than a half a teaspoon yesterday.  She denies any hematemesis.  She takes 81 mg of aspirin daily but no blood thinners.  She continues to have hoarse voice.  Past Medical History:  Diagnosis Date  . Hypertension   . UTI (lower urinary tract infection)     Patient Active Problem List   Diagnosis Date Noted  . Vitamin D insufficiency 03/07/2015  . Essential hypertension 03/04/2015  . Trichomonas vaginitis 06/08/2013    Past Surgical History:  Procedure Laterality Date  . NO PAST SURGERIES    . WISDOM TOOTH EXTRACTION       OB History    Gravida  1   Para  1   Term  1   Preterm      AB      Living  1     SAB      TAB      Ectopic      Multiple      Live Births  1            Home Medications    Prior to Admission medications   Medication Sig Start Date End Date Taking? Authorizing Provider  acetaminophen (TYLENOL) 500 MG tablet Take 500 mg by mouth every 6 (six) hours as needed (menstrual cramping).    [provider]  aspirin EC 81 MG tablet Take 81 mg  by mouth daily.    [provider]  ipratropium (ATROVENT) 0.06 % nasal spray Place 2 sprays into both nostrils 4 (four) times daily. 09/03/18   Yu, Amy V, PA-C  IRON PO Take by mouth.    [provider]  lisinopril (PRINIVIL,ZESTRIL) 10 MG tablet Take 1 tablet (10 mg total) by mouth daily. 02/17/15 08/24/16  Montine Circle, PA-C  lisinopril (PRINIVIL,ZESTRIL) 10 MG tablet Take 10 mg by mouth daily.    [provider]  loratadine (CLARITIN) 10 MG tablet Take 10 mg by mouth daily.    [provider]  predniSONE (DELTASONE) 20 MG tablet Take 1 tablet (20 mg total) by mouth 2 (two) times daily with a meal. 01/18/19   Raylene Everts, MD  traZODone (DESYREL) 50 MG tablet Take 50 mg by mouth at bedtime.    [provider]    Family History Family History  Problem Relation Age of Onset  . Diabetes Maternal Grandmother   . Stroke Maternal Grandmother   . Heart disease Maternal Grandfather   . Hypertension Mother   . Asthma Mother   . Asthma Sister   .  Hypertension Maternal Aunt   . Cancer Maternal Aunt        breast cancer  . Hypertension Maternal Uncle     Social History Social History   Tobacco Use  . Smoking status: Current Some Day Smoker    Packs/day: 0.10  . Smokeless tobacco: Never Used  Substance Use Topics  . Alcohol use: Not Currently  . Drug use: Not Currently     Allergies   Influenza vaccines; Peach [prunus persica]; and Penicillins   Review of Systems Review of Systems  Constitutional: Negative for chills and fever.  HENT: Positive for voice change. Negative for congestion, rhinorrhea, sinus pain and sore throat.   Eyes: Negative for visual disturbance.  Respiratory: Positive for cough. Negative for chest tightness and shortness of breath.   Cardiovascular: Negative for chest pain.  Gastrointestinal: Negative for abdominal pain, nausea and vomiting.  Genitourinary: Negative for dysuria.  Musculoskeletal:  Negative for back pain and myalgias.  Skin: Negative for rash.  Neurological: Negative for dizziness, syncope, light-headedness and headaches.     Physical Exam Updated Vital Signs BP (!) 133/95   Pulse 75   Temp 98.7 F (37.1 C) (Oral)   Resp 16   Ht 5\' 2"  (1.575 m)   Wt 59 kg   SpO2 100%   BMI 23.78 kg/m   Physical Exam Vitals signs and nursing note reviewed.  Constitutional:      General: She is not in acute distress.    Appearance: Normal appearance. She is well-developed. She is not ill-appearing or diaphoretic.     Comments: Hoarse voice but no stridor. Very well appearing.   HENT:     Head: Normocephalic and atraumatic.     Right Ear: Tympanic membrane normal.     Left Ear: Tympanic membrane normal.     Mouth/Throat:     Comments: Normal phonation. No muffled voice sounds. Patient swallows secretions without difficulty. Dentition poor. No lesions of tongue or buccal mucosa. Uvula midline. No asymmetric swelling of the posterior pharynx.No erythema of posterior pharynx. No tonsillar exuduate. No lingual swelling. No induration inferior to tongue. No submandibular tenderness, swelling, or induration.  Tissues of the neck supple. No cervical lymphadenopathy. Right TM without erythema or effusion; left TM without erythema or effusion.  Eyes:     Conjunctiva/sclera: Conjunctivae normal.     Pupils: Pupils are equal, round, and reactive to light.  Neck:     Musculoskeletal: Normal range of motion and neck supple.  Cardiovascular:     Rate and Rhythm: Normal rate and regular rhythm.     Heart sounds: S1 normal and S2 normal. No murmur.  Pulmonary:     Effort: Pulmonary effort is normal.     Breath sounds: Normal breath sounds. No wheezing or rales.  Abdominal:     General: There is no distension.     Palpations: Abdomen is soft.     Tenderness: There is no abdominal tenderness. There is no guarding.  Musculoskeletal: Normal range of motion.        General: No  deformity.  Lymphadenopathy:     Cervical: No cervical adenopathy.  Skin:    General: Skin is warm and dry.     Findings: No erythema or rash.  Neurological:     Mental Status: She is alert.     Comments: Cranial nerves grossly intact. Patient moves extremities symmetrically and with good coordination.  Psychiatric:        Behavior: Behavior normal.  Thought Content: Thought content normal.        Judgment: Judgment normal.      ED Treatments / Results  Labs (all labs ordered are listed, but only abnormal results are displayed) Labs Reviewed  POC URINE PREG, ED    EKG None  Radiology Dg Chest 2 View  Result Date: 01/19/2019 CLINICAL DATA:  Dry cough since 01/14/2019. EXAM: CHEST - 2 VIEW COMPARISON:  PA and lateral chest 02/21/2015. FINDINGS: The lungs are clear. Heart size is normal. No pneumothorax or pleural effusion. No acute or focal bony abnormality. IMPRESSION: Negative chest. Electronically Signed   By: Inge Rise M.D.   On: 01/19/2019 13:26    Procedures Procedures (including critical care time)  Medications Ordered in ED Medications  dexamethasone (DECADRON) injection 10 mg (10 mg Intramuscular Given 01/19/19 1411)  albuterol (PROVENTIL HFA;VENTOLIN HFA) 108 (90 Base) MCG/ACT inhaler 1 puff (1 puff Inhalation Given 01/19/19 1411)     Initial Impression / Assessment and Plan / ED Course  I have reviewed the triage vital signs and the nursing notes.  Pertinent labs & imaging results that were available during my care of the patient were reviewed by me and considered in my medical decision making (see chart for details).        Patient nontoxic-appearing, afebrile, and hemodynamically stable.  She has a hoarse voice, but no evidence of stridor, shortness of breath, or evidence of RPA or PTA on exam.  Suspect laryngitis.  Patient reports scant hemoptysis.  The most likely cause of this is bronchial irritation she is not on blood thinners.  This is  very clearly from cough and not hematemesis.  Chest x-ray performed and no evidence of acute cardiopulmonary disease.  This was reviewed by me.  Patient given Decadron here in the emergency department in lieu of prednisone by UC.  Return precautions given for any fevers, worsening cough, or large-volume hemoptysis.  Patient is in understanding and agrees with the plan of care.  Final Clinical Impressions(s) / ED Diagnoses   Final diagnoses:  Laryngitis  Cough  Hemoptysis    ED Discharge Orders         Ordered    cetirizine (ZYRTEC) 10 MG tablet  Daily,   Status:  Discontinued     01/19/19 [place], Taijuan Serviss B, PA-C 01/19/19 2250    Valarie Merino, MD 01/21/19 [phone]

## 2019-01-19 NOTE — Discharge Instructions (Signed)
Please read and follow all provided instructions.  Your diagnoses today include:  1. Laryngitis   2. Cough   3. Hemoptysis     You appear to have an upper respiratory infection (URI). An upper respiratory tract infection, or cold, is a viral infection of the air passages leading to the lungs. It should improve gradually after 5-7 days. You may have a lingering cough that lasts for 2- 4 weeks after the infection.  Tests performed today include: Vital signs. See below for your results today.   Medications prescribed:   Take any prescribed medications only as directed. Treatment for your infection is aimed at treating the symptoms. There are no medications, such as antibiotics, that will cure your infection.   Home care instructions:  Follow any educational materials contained in this packet.   Your illness is contagious and can be spread to others, especially during the first 3 or 4 days. It cannot be cured by antibiotics or other medicines. Take basic precautions such as washing your hands often, covering your mouth when you cough or sneeze, and avoiding public places where you could spread your illness to others.   Please continue drinking plenty of fluids.  Use over-the-counter medicines as needed as directed on packaging for symptom relief.  You may also use ibuprofen or tylenol as directed on packaging for pain or fever.  Do not take multiple medicines containing Tylenol or acetaminophen to avoid taking too much of this medication.  Follow-up instructions: Please follow-up with your primary care provider in the next 3 days for further evaluation of your symptoms if you are not feeling better.   Return instructions:  Please return to the Emergency Department if you experience worsening symptoms.  RETURN IMMEDIATELY IF you develop shortness of breath, chest pain, confusion or altered mental status, a new rash, become dizzy, faint, or poorly responsive, or are unable to be cared for at  home. Please return if you have persistent vomiting and cannot keep down fluids or develop a fever that is not controlled by tylenol or motrin.   Please return if you have any other emergent concerns.  Additional Information:  Your vital signs today were: BP 118/87    Pulse 71    Temp 98.7 F (37.1 C) (Oral)    Resp 16    Ht 5\' 2"  (1.575 m)    Wt 59 kg    SpO2 100%    BMI 23.78 kg/m  If your blood pressure (BP) was elevated above 135/85 this visit, please have this repeated by your doctor within one month. --------------

## 2019-01-19 NOTE — ED Triage Notes (Signed)
Pt from home; c/o hematemesis that began last night; pt also c/o "a dry, nagging cough" that only comes at night that began last Wednesday; denies fevers, denies being around anyone sick; pt seen at urgent care yesterday, diagnosed w/ laryngitis; was given prescription for prednisone but says she cannot afford it. Denies cp, sob

## 2020-03-26 IMAGING — DX DG SHOULDER 2+V*R*
3 series · 3 of 3 positions shown · non-contrast
Comparison: None.

CLINICAL DATA: Fell from bed today. Superior right shoulder pain.
Initial encounter.

EXAM:
RIGHT SHOULDER - 2+ VIEW

[w shoulder external right]
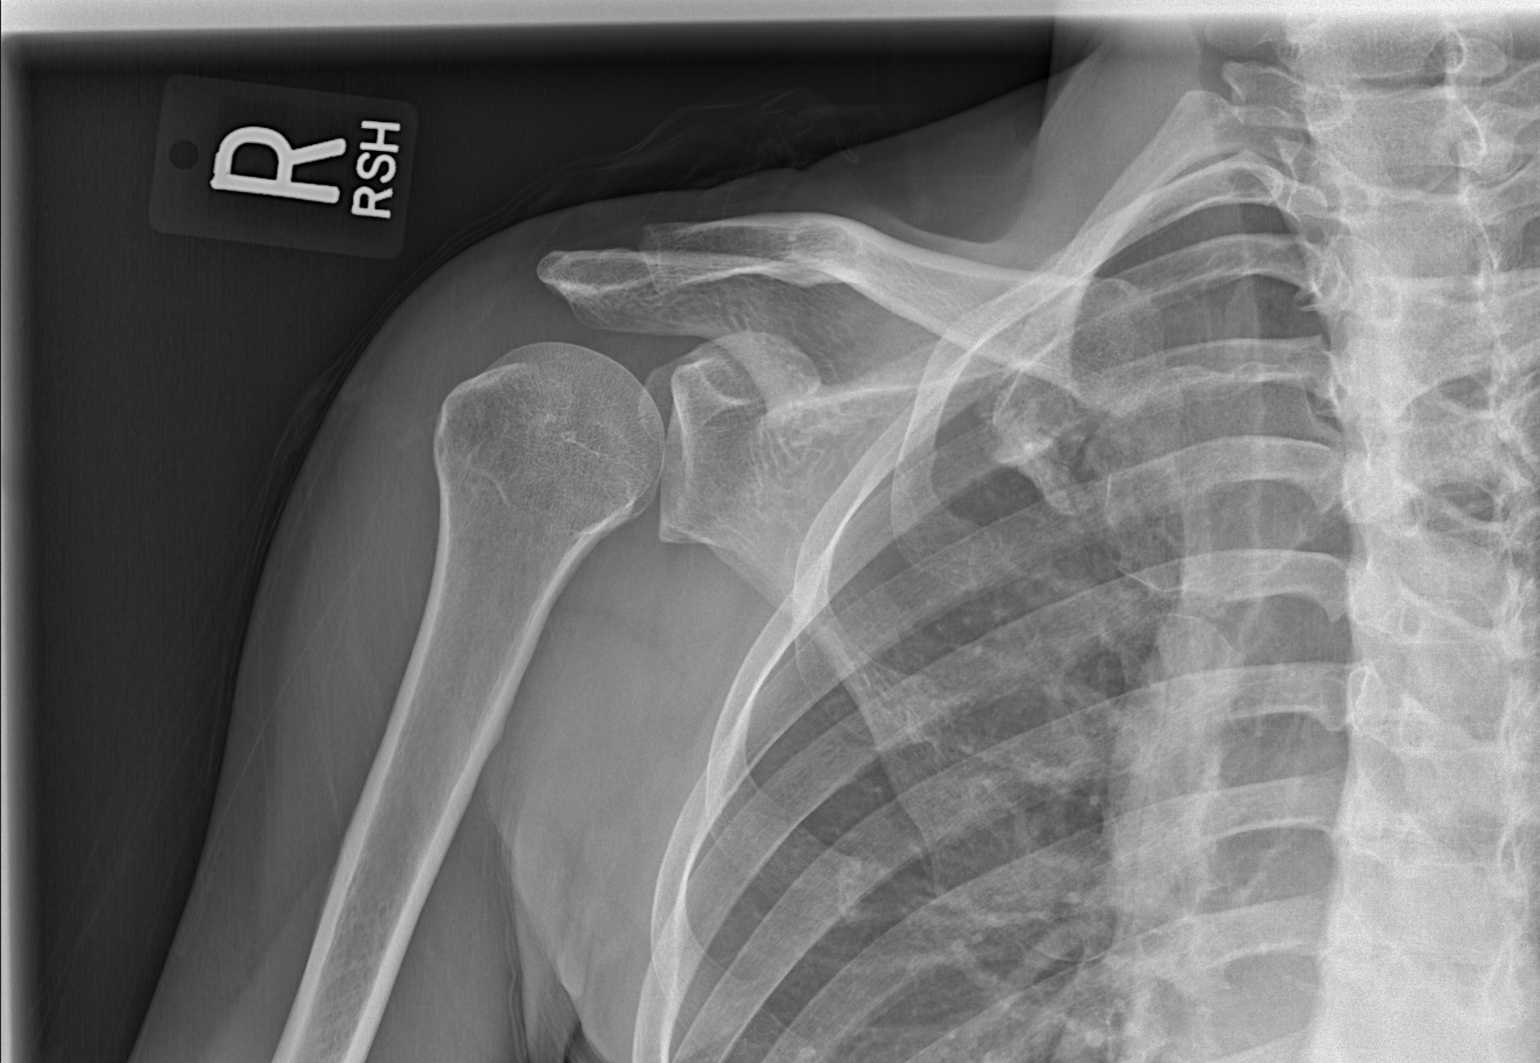

[w shoulder y-view right]
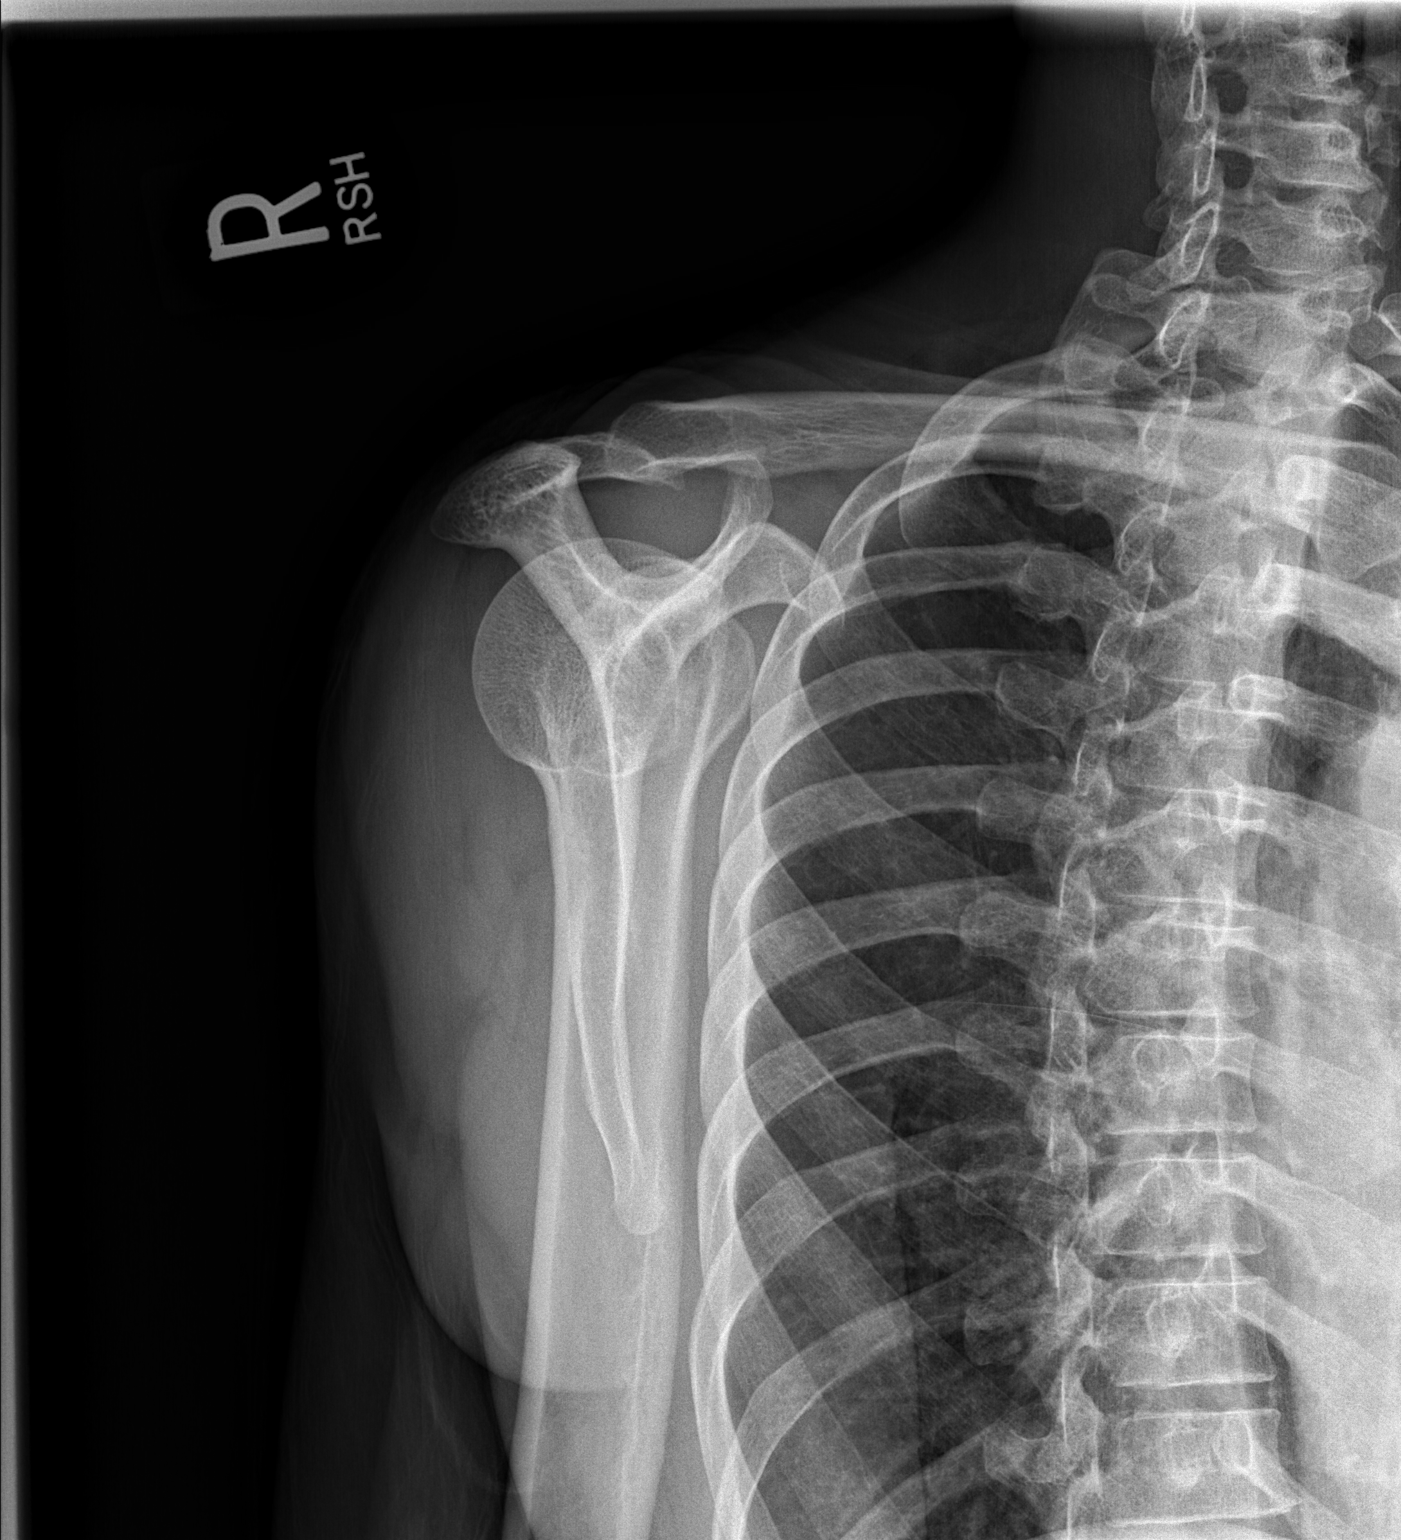

[x shoulder axillary right]
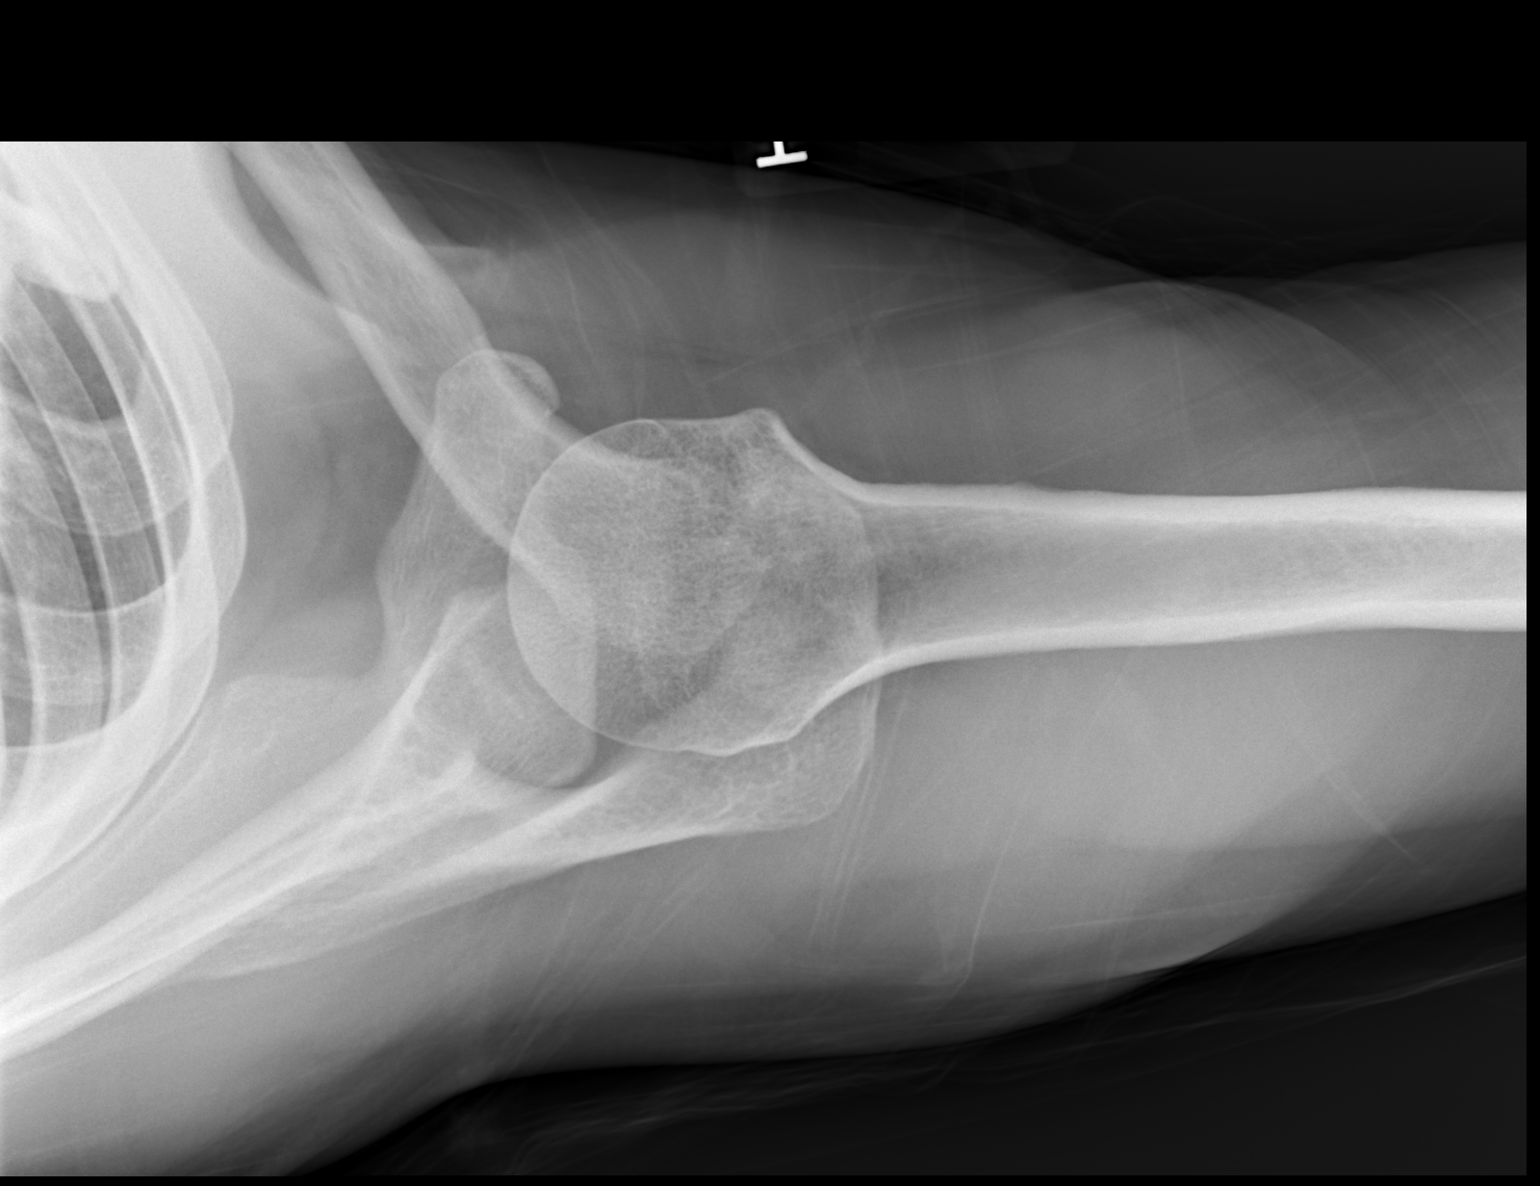

[3 of 3 positions shown; findings below may reference images not displayed]

FINDINGS: There is no evidence of fracture or dislocation. There is no
evidence of arthropathy or other focal bone abnormality. Soft
tissues are unremarkable.
IMPRESSION: Negative.

## 2021-03-17 ENCOUNTER — Encounter (HOSPITAL_COMMUNITY): Payer: Self-pay

## 2021-03-17 ENCOUNTER — Ambulatory Visit (HOSPITAL_COMMUNITY)
Admission: EM | Admit: 2021-03-17 | Discharge: 2021-03-17 | Disposition: A | Discharge status: Home / Self Care | Payer: Self-pay

## 2021-03-17 DIAGNOSIS — L299 Pruritus, unspecified: Secondary | ICD-10-CM

## 2021-03-17 DIAGNOSIS — R350 Frequency of micturition: Secondary | ICD-10-CM

## 2021-03-17 LAB — POCT URINALYSIS DIPSTICK, ED / UC
Bilirubin Urine: NEGATIVE
Glucose, UA: NEGATIVE mg/dL
Hgb urine dipstick: NEGATIVE
Leukocytes,Ua: NEGATIVE
Nitrite: NEGATIVE
Protein, ur: NEGATIVE mg/dL
Specific Gravity, Urine: 1.02 (ref 1.005–1.030)
Urobilinogen, UA: 2 mg/dL — ABNORMAL HIGH (ref 0.0–1.0)
pH: 7 (ref 5.0–8.0)

## 2021-03-17 NOTE — Discharge Instructions (Signed)
Use bendaryl cream for itching.  You can also use a really good lotion, such as Eucerin or Aquaphor for Eczema. Try to find a white lotion that is unscented.    Avoid taking hot water showers as this can dry your skin out and make the itching worse.   Make sure you are drinking plenty of water.   Follow up with your primary care provider or go to the Emergency Department if symptoms worsen or do not improve in the next few days.

## 2021-03-17 NOTE — ED Triage Notes (Addendum)
Pt states that she ate cinnamon apple sauce and chicken last night, then started itching when she got in bed. States had red welts/bruising on her upon waking this morning. States the marks have gone away but is still itching all over.   Pt then adds that starting last night she has been urinating more frequently. Reports some low back pain but that she believes this is her menstrual cycle coming on.

## 2021-03-17 NOTE — ED Provider Notes (Signed)
[facility]    CSN: [id number] Arrival date & time: 03/17/21  1607      History   Chief Complaint Chief Complaint  Patient presents with  . Pruritis  . Urinary Frequency    HPI Chelsea Washington is a 50 y.o. female.   Patient here for evaluation of rash that appeared this morning but has since improved.  Patient reports rash improved after showering.  Did not take any OTC medication for rash.  States she was told to avoid Benadryl as she is taking lisinopril for blood pressure.  Also reports having urinary frequency that started last night.  Denies any dysuria or urgency.  Denies any trauma, injury, or other precipitating event.  Denies any specific alleviating or aggravating factors.  Denies any fevers, chest pain, shortness of breath, N/V/D, numbness, tingling, weakness, abdominal pain, or headaches.   ROS: As per HPI, all other pertinent ROS negative   The history is provided by the patient.  Urinary Frequency    Past Medical History:  Diagnosis Date  . Hypertension   . UTI (lower urinary tract infection)     Patient Active Problem List   Diagnosis Date Noted  . Vitamin D insufficiency 03/07/2015  . Essential hypertension 03/04/2015  . Trichomonas vaginitis 06/08/2013    Past Surgical History:  Procedure Laterality Date  . NO PAST SURGERIES    . WISDOM TOOTH EXTRACTION      OB History    Gravida  1   Para  1   Term  1   Preterm      AB      Living  1     SAB      IAB      Ectopic      Multiple      Live Births  1            Home Medications    Prior to Admission medications   Medication Sig Start Date End Date Taking? Authorizing Provider  acetaminophen (TYLENOL) 500 MG tablet Take 500 mg by mouth every 6 (six) hours as needed (menstrual cramping).   Yes [provider]  ipratropium (ATROVENT) 0.06 % nasal spray Place 2 sprays into both nostrils 4 (four) times daily. 09/03/18  Yes Yu, Amy V, PA-C  IRON PO Take by  mouth.   Yes [provider]  lisinopril (PRINIVIL,ZESTRIL) 10 MG tablet Take 10 mg by mouth daily.   Yes [provider]  loratadine (CLARITIN) 10 MG tablet Take 10 mg by mouth daily.   Yes [provider]  traZODone (DESYREL) 50 MG tablet Take 50 mg by mouth at bedtime.   Yes [provider]  ZINC SULFATE PO Take by mouth.   Yes [provider]  aspirin EC 81 MG tablet Take 81 mg by mouth daily.    [provider]  lisinopril (PRINIVIL,ZESTRIL) 10 MG tablet Take 1 tablet (10 mg total) by mouth daily. 02/17/15 08/24/16  Montine Circle, PA-C  predniSONE (DELTASONE) 20 MG tablet Take 1 tablet (20 mg total) by mouth 2 (two) times daily with a meal. 01/18/19   Raylene Everts, MD    Family History Family History  Problem Relation Age of Onset  . Diabetes Maternal Grandmother   . Stroke Maternal Grandmother   . Heart disease Maternal Grandfather   . Hypertension Mother   . Asthma Mother   . Asthma Sister   . Hypertension Maternal Aunt   . Cancer Maternal Aunt  breast cancer  . Hypertension Maternal Uncle     Social History Social History   Tobacco Use  . Smoking status: Current Some Day Smoker    Packs/day: 0.10  . Smokeless tobacco: Never Used  Vaping Use  . Vaping Use: Never used  Substance Use Topics  . Alcohol use: Not Currently  . Drug use: Not Currently     Allergies   Influenza vaccines, Peach [prunus persica], and Penicillins   Review of Systems Review of Systems  Genitourinary: Positive for frequency.  Skin: Positive for rash.  All other systems reviewed and are negative.    Physical Exam Triage Vital Signs ED Triage Vitals [03/17/21 1114]  Enc Vitals Group     BP      Pulse      Resp      Temp      Temp src      SpO2      Weight      Height      Head Circumference      Peak Flow      Pain Score 0     Pain Loc      Pain Edu?      Excl. in [place]?    No data found.  Updated Vital  Signs BP (!) 146/102   Pulse 79   Temp 97.8 F (36.6 C)   Resp 18   LMP 02/15/2021   SpO2 100%   Visual Acuity Right Eye Distance:   Left Eye Distance:   Bilateral Distance:    Right Eye Near:   Left Eye Near:    Bilateral Near:     Physical Exam Vitals and nursing note reviewed.  Constitutional:      General: She is not in acute distress.    Appearance: Normal appearance. She is not ill-appearing, toxic-appearing or diaphoretic.  HENT:     Head: Normocephalic and atraumatic.  Eyes:     Conjunctiva/sclera: Conjunctivae normal.  Cardiovascular:     Rate and Rhythm: Normal rate.     Pulses: Normal pulses.  Pulmonary:     Effort: Pulmonary effort is normal.  Abdominal:     General: Abdomen is flat.     Tenderness: There is no right CVA tenderness or left CVA tenderness.  Musculoskeletal:        General: Normal range of motion.     Cervical back: Normal range of motion.  Skin:    General: Skin is warm and dry.     Findings: No rash.  Neurological:     General: No focal deficit present.     Mental Status: She is alert and oriented to person, place, and time.  Psychiatric:        Mood and Affect: Mood normal.      UC Treatments / Results  Labs (all labs ordered are listed, but only abnormal results are displayed) Labs Reviewed  POCT URINALYSIS DIPSTICK, ED / UC - Abnormal; Notable for the following components:      Result Value   Ketones, ur TRACE (*)    Urobilinogen, UA 2.0 (*)    All other components within normal limits    EKG   Radiology No results found.  Procedures Procedures (including critical care time)  Medications Ordered in UC Medications - No data to display  Initial Impression / Assessment and Plan / UC Course  I have reviewed the triage vital signs and the nursing notes.  Pertinent labs & imaging results that were available  during my care of the patient were reviewed by me and considered in my medical decision making (see chart for  details).    Assessment negative for red flags or concerns.  There is no rash present at this time.  Recommend using bendaryl cream for itching and an emollient lotion, such as Eucerin or Aquaphor.  Avoid taking hot water showers.  Encouraged patient to drink plenty of water.  Follow up with primary care.    Final Clinical Impressions(s) / UC Diagnoses   Final diagnoses:  Pruritus  Urinary frequency     Discharge Instructions     Use bendaryl cream for itching.  You can also use a really good lotion, such as Eucerin or Aquaphor for Eczema. Try to find a white lotion that is unscented.    Avoid taking hot water showers as this can dry your skin out and make the itching worse.   Make sure you are drinking plenty of water.   Follow up with your primary care provider or go to the Emergency Department if symptoms worsen or do not improve in the next few days.      ED Prescriptions    None     PDMP not reviewed this encounter.   Pearson Forster, NP 03/17/21 [phone]

## 2021-05-03 ENCOUNTER — Other Ambulatory Visit: Payer: Self-pay | Admitting: Obstetrics and Gynecology

## 2021-05-03 DIAGNOSIS — Z1231 Encounter for screening mammogram for malignant neoplasm of breast: Secondary | ICD-10-CM

## 2021-05-18 ENCOUNTER — Ambulatory Visit: Payer: Self-pay

## 2023-02-07 DIAGNOSIS — F1220 Cannabis dependence, uncomplicated: Secondary | ICD-10-CM | POA: Diagnosis not present

## 2023-02-21 DIAGNOSIS — F1220 Cannabis dependence, uncomplicated: Secondary | ICD-10-CM | POA: Diagnosis not present

## 2023-03-07 DIAGNOSIS — F1220 Cannabis dependence, uncomplicated: Secondary | ICD-10-CM | POA: Diagnosis not present

## 2023-03-21 DIAGNOSIS — F3110 Bipolar disorder, current episode manic without psychotic features, unspecified: Secondary | ICD-10-CM | POA: Diagnosis not present

## 2023-04-04 DIAGNOSIS — F1410 Cocaine abuse, uncomplicated: Secondary | ICD-10-CM | POA: Diagnosis not present

## 2023-04-18 DIAGNOSIS — F1410 Cocaine abuse, uncomplicated: Secondary | ICD-10-CM | POA: Diagnosis not present

## 2023-05-06 DIAGNOSIS — F3110 Bipolar disorder, current episode manic without psychotic features, unspecified: Secondary | ICD-10-CM | POA: Diagnosis not present

## 2023-05-23 DIAGNOSIS — F3110 Bipolar disorder, current episode manic without psychotic features, unspecified: Secondary | ICD-10-CM | POA: Diagnosis not present

## 2023-06-06 DIAGNOSIS — F1220 Cannabis dependence, uncomplicated: Secondary | ICD-10-CM | POA: Diagnosis not present

## 2023-06-10 DIAGNOSIS — Z1329 Encounter for screening for other suspected endocrine disorder: Secondary | ICD-10-CM | POA: Diagnosis not present

## 2023-06-10 DIAGNOSIS — N926 Irregular menstruation, unspecified: Secondary | ICD-10-CM | POA: Diagnosis not present

## 2023-06-10 DIAGNOSIS — I10 Essential (primary) hypertension: Secondary | ICD-10-CM | POA: Diagnosis not present

## 2023-06-10 DIAGNOSIS — E559 Vitamin D deficiency, unspecified: Secondary | ICD-10-CM | POA: Diagnosis not present

## 2023-06-10 DIAGNOSIS — Z113 Encounter for screening for infections with a predominantly sexual mode of transmission: Secondary | ICD-10-CM | POA: Diagnosis not present

## 2023-06-20 DIAGNOSIS — F1220 Cannabis dependence, uncomplicated: Secondary | ICD-10-CM | POA: Diagnosis not present

## 2023-07-11 DIAGNOSIS — F1220 Cannabis dependence, uncomplicated: Secondary | ICD-10-CM | POA: Diagnosis not present

## 2023-07-11 DIAGNOSIS — I10 Essential (primary) hypertension: Secondary | ICD-10-CM | POA: Diagnosis not present

## 2023-07-11 DIAGNOSIS — A5901 Trichomonal vulvovaginitis: Secondary | ICD-10-CM | POA: Diagnosis not present

## 2023-07-25 DIAGNOSIS — F3110 Bipolar disorder, current episode manic without psychotic features, unspecified: Secondary | ICD-10-CM | POA: Diagnosis not present

## 2023-08-08 DIAGNOSIS — F1410 Cocaine abuse, uncomplicated: Secondary | ICD-10-CM | POA: Diagnosis not present

## 2023-08-16 ENCOUNTER — Encounter (HOSPITAL_COMMUNITY): Payer: Self-pay

## 2023-08-16 ENCOUNTER — Ambulatory Visit (HOSPITAL_COMMUNITY)
Admission: EM | Admit: 2023-08-16 | Discharge: 2023-08-16 | Disposition: A | Discharge status: Home / Self Care | Payer: 59 | Attending: Emergency Medicine | Admitting: Emergency Medicine

## 2023-08-16 DIAGNOSIS — R197 Diarrhea, unspecified: Secondary | ICD-10-CM

## 2023-08-16 DIAGNOSIS — R112 Nausea with vomiting, unspecified: Secondary | ICD-10-CM | POA: Diagnosis not present

## 2023-08-16 MED ORDER — ONDANSETRON 4 MG PO TBDP
4.0000 mg | ORAL_TABLET | Freq: Once | ORAL | Status: AC
  Administered 2023-08-16: 4 mg via ORAL

## 2023-08-16 MED ORDER — ONDANSETRON 4 MG PO TBDP
ORAL_TABLET | ORAL | Status: AC
  Filled 2023-08-16: qty 1

## 2023-08-16 MED ORDER — ONDANSETRON 8 MG PO TBDP: 8.0000 mg | ORAL_TABLET | Freq: Three times a day (TID) | ORAL | 0 refills | Status: AC | PRN

## 2023-08-16 NOTE — ED Triage Notes (Signed)
Vomiting, hot and cold x 2 days. Emesis started yesterday after work. No one sick or with similar symptoms. No dietary or medication changes.   Patient has not tried any meds for her symptoms.

## 2023-08-16 NOTE — Discharge Instructions (Addendum)
You can use the nausea medicine as needed.  Ensure you are following a bland diet with bananas, rice, toast, applesauce and other bland items.  Do not eat any fried foods or spicy foods.   Return to clinic or seek immediate care if you develop recurrent vomiting, or unable to hold down food or fluids, have severe abdominal pain, or any new concerning symptoms.

## 2023-08-16 NOTE — ED Provider Notes (Signed)
[facility]    CSN: [id number] Arrival date & time: 08/16/23  1356      History   Chief Complaint Chief Complaint  Patient presents with   Emesis   Fever    HPI Chelsea Washington is a 52 y.o. female.   Patient presents to clinic for emesis, diarrhea, and chills. Thinks it was something she ate. Emesis started yesterday after work, she was feeling nauseous throughout the day.  She did eat at a church event prior to the nausea starting.  She had some soft stool this morning.  Emesis has been clear.  She has felt hot and cold, has not checked her temperature.  Last emesis was early this morning.  Afterwards she was able to hold down some tea and a pancake.  She is not currently nauseated but her mouth does feel watery.  Denies abdominal pain.    The history is provided by the patient and medical records.  Emesis Associated symptoms: chills and diarrhea   Associated symptoms: no abdominal pain and no fever   Fever Associated symptoms: chills, diarrhea and vomiting   Associated symptoms: no nausea     Past Medical History:  Diagnosis Date   Hypertension    UTI (lower urinary tract infection)     Patient Active Problem List   Diagnosis Date Noted   Vitamin D insufficiency 03/07/2015   Essential hypertension 03/04/2015   Trichomonas vaginitis 06/08/2013    Past Surgical History:  Procedure Laterality Date   NO PAST SURGERIES     WISDOM TOOTH EXTRACTION      OB History     Gravida  1   Para  1   Term  1   Preterm      AB      Living  1      SAB      IAB      Ectopic      Multiple      Live Births  1            Home Medications    Prior to Admission medications   Medication Sig Start Date End Date Taking? Authorizing Provider  IRON PO Take by mouth.   Yes [provider]  lisinopril (PRINIVIL,ZESTRIL) 10 MG tablet Take 10 mg by mouth daily.   Yes [provider]  loratadine (CLARITIN) 10 MG tablet Take 10 mg  by mouth daily.   Yes [provider]  ondansetron (ZOFRAN-ODT) 8 MG disintegrating tablet Take 1 tablet (8 mg total) by mouth every 8 (eight) hours as needed. 08/16/23  Yes Rinaldo Ratel, [place] N, FNP  ZINC SULFATE PO Take by mouth.   Yes [provider]  acetaminophen (TYLENOL) 500 MG tablet Take 500 mg by mouth every 6 (six) hours as needed (menstrual cramping).    [provider]  aspirin EC 81 MG tablet Take 81 mg by mouth daily.    [provider]  ipratropium (ATROVENT) 0.06 % nasal spray Place 2 sprays into both nostrils 4 (four) times daily. 09/03/18   Cathie Hoops, Amy V, PA-C  lisinopril (PRINIVIL,ZESTRIL) 10 MG tablet Take 1 tablet (10 mg total) by mouth daily. 02/17/15 08/24/16  Roxy Horseman, PA-C  predniSONE (DELTASONE) 20 MG tablet Take 1 tablet (20 mg total) by mouth 2 (two) times daily with a meal. 01/18/19   Eustace Moore, MD  traZODone (DESYREL) 50 MG tablet Take 50 mg by mouth at bedtime.    [provider]  Family History Family History  Problem Relation Age of Onset   Diabetes Maternal Grandmother    Stroke Maternal Grandmother    Heart disease Maternal Grandfather    Hypertension Mother    Asthma Mother    Asthma Sister    Hypertension Maternal Aunt    Cancer Maternal Aunt        breast cancer   Hypertension Maternal Uncle     Social History Social History   Tobacco Use   Smoking status: Some Days    Current packs/day: 0.10    Types: Cigarettes   Smokeless tobacco: Never  Vaping Use   Vaping status: Never Used  Substance Use Topics   Alcohol use: Not Currently   Drug use: Not Currently     Allergies   Influenza vaccines, Peach [prunus persica], and Penicillins   Review of Systems Review of Systems  Constitutional:  Positive for chills. Negative for fever.  Gastrointestinal:  Positive for diarrhea and vomiting. Negative for abdominal pain and nausea.     Physical Exam Triage Vital Signs ED Triage  Vitals  Encounter Vitals Group     BP 08/16/23 1439 (!) 143/95     Systolic BP Percentile --      Diastolic BP Percentile --      Pulse Rate 08/16/23 1439 86     Resp 08/16/23 1439 18     Temp 08/16/23 1439 98.7 F (37.1 C)     Temp Source 08/16/23 1439 Oral     SpO2 08/16/23 1439 98 %     Weight 08/16/23 1439 120 lb (54.4 kg)     Height 08/16/23 1439 5\' 1"  (1.549 m)     Head Circumference --      Peak Flow --      Pain Score 08/16/23 1437 0     Pain Loc --      Pain Education --      Exclude from Growth Chart --    No data found.  Updated Vital Signs BP (!) 143/95 (BP Location: Right Arm)   Pulse 86   Temp 98.7 F (37.1 C) (Oral)   Resp 18   Ht 5\' 1"  (1.549 m)   Wt 120 lb (54.4 kg)   LMP 08/01/2023 (Approximate)   SpO2 98%   BMI 22.67 kg/m   Visual Acuity Right Eye Distance:   Left Eye Distance:   Bilateral Distance:    Right Eye Near:   Left Eye Near:    Bilateral Near:     Physical Exam Vitals and nursing note reviewed.  Constitutional:      Appearance: Normal appearance.  HENT:     Head: Normocephalic and atraumatic.     Right Ear: External ear normal.     Left Ear: External ear normal.     Nose: Nose normal.     Mouth/Throat:     Mouth: Mucous membranes are moist.  Eyes:     Conjunctiva/sclera: Conjunctivae normal.  Cardiovascular:     Rate and Rhythm: Normal rate and regular rhythm.     Heart sounds: Normal heart sounds. No murmur heard. Pulmonary:     Effort: Pulmonary effort is normal. No respiratory distress.     Breath sounds: Normal breath sounds.  Abdominal:     General: Abdomen is flat. Bowel sounds are normal.     Palpations: Abdomen is soft.     Tenderness: There is no abdominal tenderness. There is no guarding.  Musculoskeletal:  General: Normal range of motion.  Skin:    General: Skin is warm and dry.  Neurological:     General: No focal deficit present.     Mental Status: She is alert and oriented to person, place, and  time.  Psychiatric:        Mood and Affect: Mood normal.        Behavior: Behavior normal. Behavior is cooperative.      UC Treatments / Results  Labs (all labs ordered are listed, but only abnormal results are displayed) Labs Reviewed - No data to display  EKG   Radiology No results found.  Procedures Procedures (including critical care time)  Medications Ordered in UC Medications  ondansetron (ZOFRAN-ODT) disintegrating tablet 4 mg (4 mg Oral Given 08/16/23 1508)    Initial Impression / Assessment and Plan / UC Course  I have reviewed the triage vital signs and the nursing notes.  Pertinent labs & imaging results that were available during my care of the patient were reviewed by me and considered in my medical decision making (see chart for details).  Vitals and triage reviewed, patient is hemodynamically stable.  No current emesis.  No abdominal pain.  Abdomen is soft and nontender with active bowel sounds.  Symptoms consistent with a viral gastroenteritis versus food poisoning.  Will treat symptomatically.  Tolerating p.o. food and fluids at this time.  ODT Zofran given in clinic for nausea.  Bland diet encouraged.  Follow-up and return precautions discussed, no questions at this time.     Final Clinical Impressions(s) / UC Diagnoses   Final diagnoses:  Nausea vomiting and diarrhea     Discharge Instructions      You can use the nausea medicine as needed.  Ensure you are following a bland diet with bananas, rice, toast, applesauce and other bland items.  Do not eat any fried foods or spicy foods.   Return to clinic or seek immediate care if you develop recurrent vomiting, or unable to hold down food or fluids, have severe abdominal pain, or any new concerning symptoms.     ED Prescriptions     Medication Sig Dispense Auth. Provider   ondansetron (ZOFRAN-ODT) 8 MG disintegrating tablet Take 1 tablet (8 mg total) by mouth every 8 (eight) hours as needed. 20  tablet Conleigh Heinlein, [place] N, [place]      PDMP not reviewed this encounter.   Daemien Fronczak, [place] N, [place] 08/16/23 [id number]

## 2023-08-22 DIAGNOSIS — F3110 Bipolar disorder, current episode manic without psychotic features, unspecified: Secondary | ICD-10-CM | POA: Diagnosis not present

## 2023-09-05 DIAGNOSIS — F1220 Cannabis dependence, uncomplicated: Secondary | ICD-10-CM | POA: Diagnosis not present

## 2023-09-19 DIAGNOSIS — F1410 Cocaine abuse, uncomplicated: Secondary | ICD-10-CM | POA: Diagnosis not present

## 2023-09-30 DIAGNOSIS — F1410 Cocaine abuse, uncomplicated: Secondary | ICD-10-CM | POA: Diagnosis not present

## 2023-10-17 DIAGNOSIS — F1220 Cannabis dependence, uncomplicated: Secondary | ICD-10-CM | POA: Diagnosis not present

## 2023-11-14 DIAGNOSIS — F1410 Cocaine abuse, uncomplicated: Secondary | ICD-10-CM | POA: Diagnosis not present

## 2023-11-28 DIAGNOSIS — F1220 Cannabis dependence, uncomplicated: Secondary | ICD-10-CM | POA: Diagnosis not present
